# Patient Record
Sex: Female | Born: 2000 | Race: Black or African American | Hispanic: No | Marital: Single | Smoking: Former smoker
Health system: Southern US, Community
[De-identification: ages and names within clinical notes are randomized; demographics above are authoritative.]

## PROBLEM LIST (undated history)

## (undated) ENCOUNTER — Emergency Department (HOSPITAL_COMMUNITY): Payer: Medicaid Other

## (undated) ENCOUNTER — Inpatient Hospital Stay: Discharge status: Absent without leave | Payer: Self-pay

## (undated) DIAGNOSIS — F419 Anxiety disorder, unspecified: Secondary | ICD-10-CM

## (undated) DIAGNOSIS — N390 Urinary tract infection, site not specified: Secondary | ICD-10-CM

## (undated) DIAGNOSIS — Z789 Other specified health status: Secondary | ICD-10-CM

## (undated) HISTORY — DX: Anxiety disorder, unspecified: F41.9

## (undated) HISTORY — DX: Other specified health status: Z78.9

## (undated) HISTORY — PX: NO PAST SURGERIES: SHX2092

## (undated) HISTORY — PX: MOUTH SURGERY: SHX715

---
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
## 2020-01-02 ENCOUNTER — Telehealth (HOSPITAL_COMMUNITY): Payer: Self-pay | Admitting: Genetic Counselor

## 2020-01-02 LAB — AFP, SERUM, OPEN SPINA BIFIDA
AFP MoM: 0.89
AFP Value: 67.2 ng/mL
Gest. Age on Collection Date: 21.7 weeks
Maternal Age At EDD: 18.8 yr
OSBR Risk 1 IN: 10000
Test Results:: NEGATIVE
Weight: 160 [lb_av]

## 2020-01-02 NOTE — Telephone Encounter (Signed)
I called Ms. Decook to discuss her partner Doy Mince Currie's results from his alpha-thalassemia carrier screening. Mr. Laureen Ochs was identified to be a silent carrier for alpha-thalassemia just like Ms. Record.   As we discussed during our genetic counseling appointment, the couple's fetus would only be at risk forhemoglobin H disease (AKA alpha-thalassemia) if Mr. Laureen Ochs were identified to be a carrier for two alpha globin mutations in cis (aa/--). Since Mr. Laureen Ochs is also a silent carrier (aa/a-), the couple's fetus is not at increased risk to be affected by the condition. Instead, the couple has a 1 in 4 (25%) chance of having an unaffected child with all four copies of the alpha globin genes present (aa/aa), a 1 in 2 (50%) chance of having a child who is a silent carrier (aa/a-), and a 1 in 4 (25%) chance of having an unaffected child who is a carrier in trans (a-/a-). Ms. Monfils inquired whether or not her daughter should be tested for her carrier status postnatally. We discussed that when she is planning to have a family of her own someday, she could consider undergoing carrier screening for alpha-thalassemia. If she were found to be a carrier, it would be recommended that her partner also undergo screening to determine the risk for their children to be affected, as was the case for Ms. Epping and Mr. Laureen Ochs.  Ms. Cumberland was relieved to hear of her partner's testing results. She confirmed that she had no further questions at this time.  Gershon Crane, MS Genetic Counselor

## 2020-01-28 ENCOUNTER — Telehealth (INDEPENDENT_AMBULATORY_CARE_PROVIDER_SITE_OTHER): Payer: Medicaid Other | Admitting: Obstetrics

## 2020-01-28 ENCOUNTER — Encounter: Payer: Self-pay | Admitting: Obstetrics

## 2020-01-28 VITALS — BP 118/67 | HR 85

## 2020-01-28 DIAGNOSIS — Z3400 Encounter for supervision of normal first pregnancy, unspecified trimester: Secondary | ICD-10-CM

## 2020-01-28 DIAGNOSIS — M549 Dorsalgia, unspecified: Secondary | ICD-10-CM

## 2020-01-28 DIAGNOSIS — D563 Thalassemia minor: Secondary | ICD-10-CM

## 2020-01-28 DIAGNOSIS — Z3A25 25 weeks gestation of pregnancy: Secondary | ICD-10-CM

## 2020-01-28 DIAGNOSIS — O26892 Other specified pregnancy related conditions, second trimester: Secondary | ICD-10-CM

## 2020-01-28 MED ORDER — COMFORT FIT MATERNITY SUPP SM MISC: 0 refills | Status: DC

## 2020-01-28 NOTE — Progress Notes (Signed)
S/W pt for virtual visit. Pt reports fetal movement, denies pain.

## 2020-01-28 NOTE — Progress Notes (Signed)
   TELEHEALTH OBSTETRICS PRENATAL VIRTUAL VIDEO VISIT ENCOUNTER NOTE  Provider location: [facility] for [facility] [place]   I connected with Tate A Mantei on 01/28/20 at  2:00 PM EST by OB MyChart Video Encounter at home and verified that I am speaking with the correct person using two identifiers.   I discussed the limitations, risks, security and privacy concerns of performing an evaluation and management service virtually and the availability of in person appointments. I also discussed with the patient that there may be a patient responsible charge related to this service. The patient expressed understanding and agreed to proceed.  Subjective:  Joyce Hodge is a 19 y.o. G1P0 at [redacted]w[redacted]d being seen today for ongoing prenatal care.  She is currently monitored for the following issues for this low-risk pregnancy and has Supervision of normal first teen pregnancy and Alpha thalassemia silent carrier on their problem list.  Patient reports backache.  Contractions: Not present. Vag. Bleeding: None.  Movement: Present. Denies any leaking of fluid.   The following portions of the patient's history were reviewed and updated as appropriate: allergies, current medications, past family history, past medical history, past social history, past surgical history and problem list.   Objective:   Vitals:   01/28/20 1400  BP: 118/67  Pulse: 85    Fetal Status:     Movement: Present     General:  Alert, oriented and cooperative. Patient is in no acute distress.  Respiratory: Normal respiratory effort, no problems with respiration noted  Mental Status: Normal mood and affect. Normal behavior. Normal judgment and thought content.  Rest of physical exam deferred due to type of encounter  Imaging: No results found.  Assessment and Plan:  Pregnancy: G1P0 at [redacted]w[redacted]d  1. Encounter for supervision of normal pregnancy in teen primigravida, antepartum  2. Alpha thalassemia silent carrier  3.  Backache symptom Rx: - Elastic Bandages & Supports (COMFORT FIT MATERNITY SUPP SM) MISC; Take as directed.  Dispense: 1 each; Refill: 0   Preterm labor symptoms and general obstetric precautions including but not limited to vaginal bleeding, contractions, leaking of fluid and fetal movement were reviewed in detail with the patient. I discussed the assessment and treatment plan with the patient. The patient was provided an opportunity to ask questions and all were answered. The patient agreed with the plan and demonstrated an understanding of the instructions. The patient was advised to call back or seek an in-person office evaluation/go to MAU at [facility] for any urgent or concerning symptoms. Please refer to After Visit Summary for other counseling recommendations.   I provided 10 minutes of face-to-face time during this encounter.  No follow-ups on file.  Future Appointments  Date Time Provider Department Center  02/18/2020  9:15 AM [facility] None  02/18/2020  9:35 AM Burleson, Brand Males, NP [facility] None    Coral Ceo, MD Center for [facility], [facility] 01/28/2020

## 2020-02-18 ENCOUNTER — Ambulatory Visit (INDEPENDENT_AMBULATORY_CARE_PROVIDER_SITE_OTHER): Payer: Medicaid Other | Admitting: Nurse Practitioner

## 2020-02-18 ENCOUNTER — Other Ambulatory Visit: Payer: Self-pay

## 2020-02-18 ENCOUNTER — Other Ambulatory Visit: Payer: Medicaid Other

## 2020-02-18 ENCOUNTER — Encounter: Payer: Self-pay | Admitting: Nurse Practitioner

## 2020-02-18 VITALS — BP 101/66 | HR 96 | Wt 169.0 lb

## 2020-02-18 DIAGNOSIS — Z3400 Encounter for supervision of normal first pregnancy, unspecified trimester: Secondary | ICD-10-CM | POA: Diagnosis not present

## 2020-02-18 DIAGNOSIS — Z3403 Encounter for supervision of normal first pregnancy, third trimester: Secondary | ICD-10-CM

## 2020-02-18 DIAGNOSIS — Z23 Encounter for immunization: Secondary | ICD-10-CM

## 2020-02-18 DIAGNOSIS — Z3A28 28 weeks gestation of pregnancy: Secondary | ICD-10-CM

## 2020-02-18 NOTE — Progress Notes (Signed)
    Subjective:  Joyce Hodge is a 19 y.o. G1P0 at [redacted]w[redacted]d being seen today for ongoing prenatal care.  She is currently monitored for the following issues for this low-risk pregnancy and has Supervision of normal first teen pregnancy and Alpha thalassemia silent carrier on their problem list.  Patient reports no complaints.  Contractions: Not present. Vag. Bleeding: None.  Movement: Present. Denies leaking of fluid.   The following portions of the patient's history were reviewed and updated as appropriate: allergies, current medications, past family history, past medical history, past social history, past surgical history and problem list. Problem list updated.  Objective:   Vitals:   02/18/20 0950  BP: 101/66  Pulse: 96  Weight: 169 lb (76.7 kg)    Fetal Status:   Fundal Height: 28 cm Movement: Present     General:  Alert, oriented and cooperative. Patient is in no acute distress.  Skin: Skin is warm and dry. No rash noted.   Cardiovascular: Normal heart rate noted  Respiratory: Normal respiratory effort, no problems with respiration noted  Abdomen: Soft, gravid, appropriate for gestational age. Pain/Pressure: Absent     Pelvic:  Cervical exam deferred        Extremities: Normal range of motion.  Edema: None  Mental Status: Normal mood and affect. Normal behavior. Normal judgment and thought content.   Urinalysis:      Assessment and Plan:  Pregnancy: G1P0 at [redacted]w[redacted]d  1. Encounter for supervision of normal pregnancy in teen primigravida, antepartum Doing well. Babyscripts data reviewed - nomal BPs Had flu at work this year Getting TDAP today. Reviewed signing up for childbirth and breastfeeding online classes - reviewed where to find more info in Babyscripts.  - Glucose Tolerance, 2 Hours w/1 Hour - CBC - RPR - HIV Antibody (routine testing w rflx)  2. Need for diphtheria-tetanus-pertussis (Tdap) vaccine Given  - Tdap vaccine greater than or equal to 7yo  IM  Preterm labor symptoms and general obstetric precautions including but not limited to vaginal bleeding, contractions, leaking of fluid and fetal movement were reviewed in detail with the patient. Please refer to After Visit Summary for other counseling recommendations.  Return in about 2 weeks (around 03/03/2020) for virtual ROB.  Nolene Bernheim, RN, MSN, NP-BC Nurse Practitioner, [facility] for [employer], [facility] 02/18/2020 10:34 AM

## 2020-02-18 NOTE — Progress Notes (Signed)
ROB with no complaints.   T-DAP

## 2020-02-19 LAB — CBC
Hematocrit: 32.8 % — ABNORMAL LOW (ref 34.0–46.6)
Hemoglobin: 10.5 g/dL — ABNORMAL LOW (ref 11.1–15.9)
MCH: 28.5 pg (ref 26.6–33.0)
MCHC: 32 g/dL (ref 31.5–35.7)
MCV: 89 fL (ref 79–97)
Platelets: 182 10*3/uL (ref 150–450)
RBC: 3.68 x10E6/uL — ABNORMAL LOW (ref 3.77–5.28)
RDW: 12.9 % (ref 11.7–15.4)
WBC: 8.2 10*3/uL (ref 3.4–10.8)

## 2020-02-19 LAB — GLUCOSE TOLERANCE, 2 HOURS W/ 1HR
Glucose, 1 hour: 170 mg/dL (ref 65–179)
Glucose, 2 hour: 136 mg/dL (ref 65–152)
Glucose, Fasting: 79 mg/dL (ref 65–91)

## 2020-02-19 LAB — RPR: RPR Ser Ql: NONREACTIVE

## 2020-02-19 LAB — HIV ANTIBODY (ROUTINE TESTING W REFLEX): HIV Screen 4th Generation wRfx: NONREACTIVE

## 2020-03-03 ENCOUNTER — Telehealth (INDEPENDENT_AMBULATORY_CARE_PROVIDER_SITE_OTHER): Payer: Medicaid Other | Admitting: Obstetrics

## 2020-03-03 ENCOUNTER — Encounter: Payer: Self-pay | Admitting: Obstetrics

## 2020-03-03 VITALS — BP 121/63 | HR 106

## 2020-03-03 DIAGNOSIS — Z3400 Encounter for supervision of normal first pregnancy, unspecified trimester: Secondary | ICD-10-CM

## 2020-03-03 DIAGNOSIS — Z3A30 30 weeks gestation of pregnancy: Secondary | ICD-10-CM

## 2020-03-03 DIAGNOSIS — Z3403 Encounter for supervision of normal first pregnancy, third trimester: Secondary | ICD-10-CM

## 2020-03-03 NOTE — Progress Notes (Signed)
Pt has had swelling in left leg. Pt has been feeling dizzy and lightheaded-first occurrence.

## 2020-03-03 NOTE — Progress Notes (Signed)
   TELEHEALTH OBSTETRICS PRENATAL VIRTUAL VIDEO VISIT ENCOUNTER NOTE  Provider location: [facility] for [facility] [place]   I connected with Joyce Hodge on 03/03/20 at 10:45 AM EST by OB MyChart Video Encounter at home and verified that I am speaking with the correct person using two identifiers.   I discussed the limitations, risks, security and privacy concerns of performing an evaluation and management service virtually and the availability of in person appointments. I also discussed with the patient that there may be a patient responsible charge related to this service. The patient expressed understanding and agreed to proceed. Subjective:  Joyce Hodge is a 19 y.o. G1P0 at [redacted]w[redacted]d being seen today for ongoing prenatal care.  She is currently monitored for the following issues for this low-risk pregnancy and has Supervision of normal first teen pregnancy and Alpha thalassemia silent carrier on their problem list.  Patient reports swelling in legs and occasional dizziness.  Contractions: Not present. Vag. Bleeding: None.  Movement: Present. Denies any leaking of fluid.   The following portions of the patient's history were reviewed and updated as appropriate: allergies, current medications, past family history, past medical history, past social history, past surgical history and problem list.   Objective:   Vitals:   03/03/20 1037  BP: 121/63  Pulse: (!) 106    Fetal Status:     Movement: Present     General:  Alert, oriented and cooperative. Patient is in no acute distress.  Respiratory: Normal respiratory effort, no problems with respiration noted  Mental Status: Normal mood and affect. Normal behavior. Normal judgment and thought content.  Rest of physical exam deferred due to type of encounter  Imaging: No results found.  Assessment and Plan:  Pregnancy: G1P0 at [redacted]w[redacted]d 1. Encounter for supervision of normal pregnancy in teen primigravida, antepartum   Preterm  labor symptoms and general obstetric precautions including but not limited to vaginal bleeding, contractions, leaking of fluid and fetal movement were reviewed in detail with the patient. I discussed the assessment and treatment plan with the patient. The patient was provided an opportunity to ask questions and all were answered. The patient agreed with the plan and demonstrated an understanding of the instructions. The patient was advised to call back or seek an in-person office evaluation/go to [facility]U at [facility] for any urgent or concerning symptoms. Please refer to After Visit Summary for other counseling recommendations.   I provided 10 minutes of face-to-face time during this encounter.  Return in about 2 weeks (around 03/17/2020) for MyChart.   Coral Ceo, MD Center for [facility], [facility] 03/03/2020

## 2020-03-17 ENCOUNTER — Encounter: Payer: Self-pay | Admitting: Obstetrics

## 2020-03-17 ENCOUNTER — Telehealth (INDEPENDENT_AMBULATORY_CARE_PROVIDER_SITE_OTHER): Payer: Medicaid Other | Admitting: Obstetrics

## 2020-03-17 DIAGNOSIS — Z5329 Procedure and treatment not carried out because of patient's decision for other reasons: Secondary | ICD-10-CM

## 2020-03-17 DIAGNOSIS — Z3400 Encounter for supervision of normal first pregnancy, unspecified trimester: Secondary | ICD-10-CM

## 2020-03-17 NOTE — Progress Notes (Signed)
Patient did not answer nurse's telephone call. 

## 2020-03-25 ENCOUNTER — Telehealth (INDEPENDENT_AMBULATORY_CARE_PROVIDER_SITE_OTHER): Payer: Medicaid Other | Admitting: Obstetrics & Gynecology

## 2020-03-25 VITALS — BP 102/63 | HR 95

## 2020-03-25 DIAGNOSIS — Z3A33 33 weeks gestation of pregnancy: Secondary | ICD-10-CM | POA: Diagnosis not present

## 2020-03-25 DIAGNOSIS — Z3403 Encounter for supervision of normal first pregnancy, third trimester: Secondary | ICD-10-CM | POA: Diagnosis not present

## 2020-03-25 NOTE — Patient Instructions (Signed)

## 2020-03-25 NOTE — Progress Notes (Signed)
   TELEHEALTH VIRTUAL OBSTETRICS VISIT ENCOUNTER NOTE  I connected with Joyce Hodge on 03/25/20 at  1:00 PM EDT by telephone at home and verified that I am speaking with the correct person using two identifiers.   I discussed the limitations, risks, security and privacy concerns of performing an evaluation and management service by telephone and the availability of in person appointments. I also discussed with the patient that there may be a patient responsible charge related to this service. The patient expressed understanding and agreed to proceed.  Subjective:  Joyce Hodge is a 19 y.o. G1P0 at [redacted]w[redacted]d being followed for ongoing prenatal care.  She is currently monitored for the following issues for this low-risk pregnancy and has Supervision of normal first teen pregnancy and Alpha thalassemia silent carrier on their problem list.  Patient reports occasional contractions. Reports fetal movement. Denies any contractions, bleeding or leaking of fluid.   The following portions of the patient's history were reviewed and updated as appropriate: allergies, current medications, past family history, past medical history, past social history, past surgical history and problem list.   Objective:   General:  Alert, oriented and cooperative.   Mental Status: Normal mood and affect perceived. Normal judgment and thought content.  Rest of physical exam deferred due to type of encounter  Assessment and Plan:  Pregnancy: G1P0 at [redacted]w[redacted]d 1. Supervision of normal first teen pregnancy in third trimester Monitor fetal movement  Preterm labor symptoms and general obstetric precautions including but not limited to vaginal bleeding, contractions, leaking of fluid and fetal movement were reviewed in detail with the patient.  I discussed the assessment and treatment plan with the patient. The patient was provided an opportunity to ask questions and all were answered. The patient agreed with the plan and  demonstrated an understanding of the instructions. The patient was advised to call back or seek an in-person office evaluation/go to MAU at [facility] for any urgent or concerning symptoms. Please refer to After Visit Summary for other counseling recommendations.   I provided 10 minutes of non-face-to-face time during this encounter.  Return in about 2 weeks (around 04/08/2020) for GBS.  No future appointments.  Scheryl Darter, MD Center for [facility], [facility]

## 2020-03-25 NOTE — Progress Notes (Signed)
Please discuss fetal movement with pt today.

## 2020-04-08 ENCOUNTER — Other Ambulatory Visit (HOSPITAL_COMMUNITY)
Admission: RE | Admit: 2020-04-08 | Discharge: 2020-04-08 | Disposition: A | Discharge status: Home / Self Care | Payer: Medicaid Other | Source: Ambulatory Visit | Attending: Advanced Practice Midwife | Admitting: Advanced Practice Midwife

## 2020-04-08 ENCOUNTER — Other Ambulatory Visit: Payer: Self-pay

## 2020-04-08 ENCOUNTER — Ambulatory Visit (INDEPENDENT_AMBULATORY_CARE_PROVIDER_SITE_OTHER): Payer: Medicaid Other | Admitting: Advanced Practice Midwife

## 2020-04-08 DIAGNOSIS — Z3403 Encounter for supervision of normal first pregnancy, third trimester: Secondary | ICD-10-CM

## 2020-04-08 DIAGNOSIS — Z3A35 35 weeks gestation of pregnancy: Secondary | ICD-10-CM

## 2020-04-08 NOTE — Progress Notes (Signed)
   PRENATAL VISIT NOTE  Subjective:  Joyce Hodge is a 19 y.o. G1P0 at [redacted]w[redacted]d being seen today for ongoing prenatal care.  She is currently monitored for the following issues for this low-risk pregnancy and has Supervision of normal first teen pregnancy and Alpha thalassemia silent carrier on their problem list.  Patient reports no complaints.  Contractions: Irritability. Vag. Bleeding: None.  Movement: Present. Denies leaking of fluid.   The following portions of the patient's history were reviewed and updated as appropriate: allergies, current medications, past family history, past medical history, past social history, past surgical history and problem list.   Objective:   Vitals:   04/08/20 1351  BP: 110/66  Pulse: (!) 106  Weight: 180 lb (81.6 kg)    Fetal Status: Fetal Heart Rate (bpm): 154 Fundal Height: 36 cm Movement: Present  Presentation: Vertex  General:  Alert, oriented and cooperative. Patient is in no acute distress.  Skin: Skin is warm and dry. No rash noted.   Cardiovascular: Normal heart rate noted  Respiratory: Normal respiratory effort, no problems with respiration noted  Abdomen: Soft, gravid, appropriate for gestational age.  Pain/Pressure: Absent     Pelvic: Cervical exam performed in the presence of a chaperone Dilation: Fingertip Effacement (%): 0 Station: Ballotable  Extremities: Normal range of motion.  Edema: None  Mental Status: Normal mood and affect. Normal behavior. Normal judgment and thought content.   Assessment and Plan:  Pregnancy: G1P0 at [redacted]w[redacted]d 1. Supervision of normal first teen pregnancy in third trimester --Anticipatory guidance about next visits/weeks of pregnancy given. --Next visit in 2 weeks virtual, then at 39 weeks in office  - Cervicovaginal ancillary only( [place]) - Strep Gp B NAA  Preterm labor symptoms and general obstetric precautions including but not limited to vaginal bleeding, contractions, leaking of fluid and  fetal movement were reviewed in detail with the patient. Please refer to After Visit Summary for other counseling recommendations.   Return in about 2 weeks (around 04/22/2020).  Future Appointments  Date Time Provider Department Center  04/22/2020 11:15 AM [place], Wilmer Floor, CNM [facility] None    Sharen Counter, CNM

## 2020-04-08 NOTE — Progress Notes (Signed)
ROB/GBS. Reports no problems today. 

## 2020-04-09 LAB — CERVICOVAGINAL ANCILLARY ONLY
Chlamydia: NEGATIVE
Comment: NEGATIVE
Comment: NORMAL
Neisseria Gonorrhea: NEGATIVE

## 2020-04-10 LAB — STREP GP B NAA: Strep Gp B NAA: NEGATIVE

## 2020-04-22 ENCOUNTER — Other Ambulatory Visit: Payer: Self-pay

## 2020-04-22 ENCOUNTER — Ambulatory Visit (INDEPENDENT_AMBULATORY_CARE_PROVIDER_SITE_OTHER): Payer: Medicaid Other | Admitting: Obstetrics

## 2020-04-22 ENCOUNTER — Encounter: Payer: Self-pay | Admitting: Obstetrics

## 2020-04-22 VITALS — BP 115/65 | HR 91 | Wt 182.0 lb

## 2020-04-22 DIAGNOSIS — Z3403 Encounter for supervision of normal first pregnancy, third trimester: Secondary | ICD-10-CM

## 2020-04-22 DIAGNOSIS — D563 Thalassemia minor: Secondary | ICD-10-CM

## 2020-04-22 NOTE — Progress Notes (Signed)
ROB  CC: Pt wants cervix check

## 2020-04-22 NOTE — Progress Notes (Signed)
Subjective:  Joyce Hodge is a 19 y.o. G1P0 at [redacted]w[redacted]d being seen today for ongoing prenatal care.  She is currently monitored for the following issues for this low-risk pregnancy and has Supervision of normal first teen pregnancy and Alpha thalassemia silent carrier on their problem list.  Patient reports occasional contractions.  Contractions: Irritability. Vag. Bleeding: None.  Movement: Present. Denies leaking of fluid.   The following portions of the patient's history were reviewed and updated as appropriate: allergies, current medications, past family history, past medical history, past social history, past surgical history and problem list. Problem list updated.  Objective:   Vitals:   04/22/20 1123  BP: 115/65  Pulse: 91  Weight: 182 lb (82.6 kg)    Fetal Status:     Movement: Present     General:  Alert, oriented and cooperative. Patient is in no acute distress.  Skin: Skin is warm and dry. No rash noted.   Cardiovascular: Normal heart rate noted  Respiratory: Normal respiratory effort, no problems with respiration noted  Abdomen: Soft, gravid, appropriate for gestational age. Pain/Pressure: Present     Pelvic:  Cervical exam deferred        Extremities: Normal range of motion.  Edema: Trace  Mental Status: Normal mood and affect. Normal behavior. Normal judgment and thought content.   Urinalysis:      Assessment and Plan:  Pregnancy: G1P0 at [redacted]w[redacted]d  There are no diagnoses linked to this encounter. term labor symptoms and general obstetric precautions including but not limited to vaginal bleeding, contractions, leaking of fluid and fetal movement were reviewed in detail with the patient. Please refer to After Visit Summary for other counseling recommendations.    Brock Bad, MD  04/22/2020

## 2020-04-29 ENCOUNTER — Other Ambulatory Visit: Payer: Self-pay

## 2020-04-29 ENCOUNTER — Ambulatory Visit (INDEPENDENT_AMBULATORY_CARE_PROVIDER_SITE_OTHER): Payer: Medicaid Other | Admitting: Family Medicine

## 2020-04-29 ENCOUNTER — Encounter: Payer: Self-pay | Admitting: Family Medicine

## 2020-04-29 DIAGNOSIS — Z3403 Encounter for supervision of normal first pregnancy, third trimester: Secondary | ICD-10-CM

## 2020-04-29 NOTE — Progress Notes (Signed)
   PRENATAL VISIT NOTE  Subjective:  Joyce Hodge is a 19 y.o. G1P0 at [redacted]w[redacted]d being seen today for ongoing prenatal care.  She is currently monitored for the following issues for this low-risk pregnancy and has Supervision of normal first teen pregnancy and Alpha thalassemia silent carrier on their problem list.  Patient reports no complaints.  Contractions: Irritability. Vag. Bleeding: None.  Movement: Present. Denies leaking of fluid.   The following portions of the patient's history were reviewed and updated as appropriate: allergies, current medications, past family history, past medical history, past social history, past surgical history and problem list.   Objective:   Vitals:   04/29/20 1539  BP: 125/81  Pulse: (!) 112  Weight: 185 lb 6.4 oz (84.1 kg)    Fetal Status: Fetal Heart Rate (bpm): 140  Fundal Height: 39 cm Movement: Present  Presentation: Vertex  General:  Alert, oriented and cooperative. Patient is in no acute distress.  Skin: Skin is warm and dry. No rash noted.   Cardiovascular: Normal heart rate noted  Respiratory: Normal respiratory effort, no problems with respiration noted  Abdomen: Soft, gravid, appropriate for gestational age.  Pain/Pressure: Present     Pelvic: Cervical exam performed in the presence of a chaperone Dilation: 1 Effacement (%): 0 Station: Ballotable  Extremities: Normal range of motion.  Edema: Trace  Mental Status: Normal mood and affect. Normal behavior. Normal judgment and thought content.   Assessment and Plan:  Pregnancy: G1P0 at [redacted]w[redacted]d 1. Supervision of normal first teen pregnancy in third trimester - RTC in 1 week with NST; IOL scheduled and orders placed - GBS neg - girl, both, OCP  Term labor symptoms and general obstetric precautions including but not limited to vaginal bleeding, contractions, leaking of fluid and fetal movement were reviewed in detail with the patient. Please refer to After Visit Summary for other  counseling recommendations.   Return in about 1 week (around 05/06/2020) for LOB; in-person.  Future Appointments  Date Time Provider Department Center  04/29/2020  4:00 PM Seniyah Esker, Hoyle Sauer, MD [facility] None    Joselyn Arrow, MD

## 2020-04-29 NOTE — Progress Notes (Signed)
Pt presents for ROB  Pt requests cx check today Pt wants BCP after delivery

## 2020-04-30 ENCOUNTER — Telehealth (HOSPITAL_COMMUNITY): Payer: Self-pay | Admitting: *Deleted

## 2020-04-30 NOTE — Telephone Encounter (Signed)
Preadmission screen  

## 2020-05-01 ENCOUNTER — Encounter (HOSPITAL_COMMUNITY): Payer: Self-pay | Admitting: *Deleted

## 2020-05-01 ENCOUNTER — Telehealth (HOSPITAL_COMMUNITY): Payer: Self-pay | Admitting: *Deleted

## 2020-05-01 NOTE — Telephone Encounter (Signed)
Preadmission screen  

## 2020-05-05 ENCOUNTER — Other Ambulatory Visit: Payer: Self-pay | Admitting: Family Medicine

## 2020-05-07 ENCOUNTER — Other Ambulatory Visit: Payer: Self-pay

## 2020-05-07 ENCOUNTER — Encounter: Payer: Medicaid Other | Admitting: Advanced Practice Midwife

## 2020-05-07 ENCOUNTER — Ambulatory Visit (INDEPENDENT_AMBULATORY_CARE_PROVIDER_SITE_OTHER): Payer: Medicaid Other | Admitting: Obstetrics

## 2020-05-07 ENCOUNTER — Encounter: Payer: Self-pay | Admitting: Obstetrics

## 2020-05-07 VITALS — BP 131/80 | HR 101 | Wt 182.0 lb

## 2020-05-07 DIAGNOSIS — Z3A40 40 weeks gestation of pregnancy: Secondary | ICD-10-CM

## 2020-05-07 DIAGNOSIS — Z3400 Encounter for supervision of normal first pregnancy, unspecified trimester: Secondary | ICD-10-CM

## 2020-05-07 NOTE — Progress Notes (Signed)
Subjective:  Joyce Hodge is a 20 y.o. G1P0 at [redacted]w[redacted]d being seen today for ongoing prenatal care.  She is currently monitored for the following issues for this low-risk pregnancy and has Supervision of normal first teen pregnancy and Alpha thalassemia silent carrier on their problem list.  Patient reports no complaints.   .  .   . Denies leaking of fluid.   The following portions of the patient's history were reviewed and updated as appropriate: allergies, current medications, past family history, past medical history, past social history, past surgical history and problem list. Problem list updated.  Objective:  There were no vitals filed for this visit.  Fetal Status:           General:  Alert, oriented and cooperative. Patient is in no acute distress.  Skin: Skin is warm and dry. No rash noted.   Cardiovascular: Normal heart rate noted  Respiratory: Normal respiratory effort, no problems with respiration noted  Abdomen: Soft, gravid, appropriate for gestational age.       Pelvic:  Cervical exam deferred        Extremities: Normal range of motion.     Mental Status: Normal mood and affect. Normal behavior. Normal judgment and thought content.   Urinalysis:      Assessment and Plan:  Pregnancy: G1P0 at [redacted]w[redacted]d  1. Encounter for supervision of normal pregnancy in teen primigravida, antepartum Rx: - Fetal nonstress test:  Reactive, FHR 150's with good variability, 15x15 accels, no decels, occasional contraction  Term labor symptoms and general obstetric precautions including but not limited to vaginal bleeding, contractions, leaking of fluid and fetal movement were reviewed in detail with the patient. Please refer to After Visit Summary for other counseling recommendations.  No follow-ups on file.   Brock Bad, MD

## 2020-05-07 NOTE — Progress Notes (Signed)
ROB NST today per last office notes Pt given juice and cracker since she has not had breakfast today.

## 2020-05-11 ENCOUNTER — Inpatient Hospital Stay (HOSPITAL_COMMUNITY): Payer: Medicaid Other | Admitting: Anesthesiology

## 2020-05-11 ENCOUNTER — Other Ambulatory Visit: Payer: Self-pay

## 2020-05-11 ENCOUNTER — Encounter (HOSPITAL_COMMUNITY): Payer: Self-pay | Admitting: Obstetrics and Gynecology

## 2020-05-11 ENCOUNTER — Inpatient Hospital Stay (HOSPITAL_COMMUNITY)
Admission: AD | Admit: 2020-05-11 | Discharge: 2020-05-14 | DRG: 833 | Disposition: A | Discharge status: Home / Self Care | Payer: Medicaid Other | Attending: Obstetrics and Gynecology | Admitting: Obstetrics and Gynecology

## 2020-05-11 ENCOUNTER — Inpatient Hospital Stay (EMERGENCY_DEPARTMENT_HOSPITAL)
Admission: AD | Admit: 2020-05-11 | Discharge: 2020-05-11 | Disposition: A | Discharge status: Home / Self Care | Payer: Medicaid Other | Source: Home / Self Care | Attending: Obstetrics and Gynecology | Admitting: Obstetrics and Gynecology

## 2020-05-11 DIAGNOSIS — O471 False labor at or after 37 completed weeks of gestation: Secondary | ICD-10-CM | POA: Diagnosis not present

## 2020-05-11 DIAGNOSIS — O9902 Anemia complicating childbirth: Secondary | ICD-10-CM | POA: Diagnosis not present

## 2020-05-11 DIAGNOSIS — O480 Post-term pregnancy: Secondary | ICD-10-CM | POA: Diagnosis present

## 2020-05-11 DIAGNOSIS — Z3A40 40 weeks gestation of pregnancy: Secondary | ICD-10-CM

## 2020-05-11 DIAGNOSIS — O479 False labor, unspecified: Secondary | ICD-10-CM

## 2020-05-11 DIAGNOSIS — D649 Anemia, unspecified: Secondary | ICD-10-CM | POA: Diagnosis not present

## 2020-05-11 DIAGNOSIS — Z20822 Contact with and (suspected) exposure to covid-19: Secondary | ICD-10-CM | POA: Diagnosis present

## 2020-05-11 DIAGNOSIS — Z87891 Personal history of nicotine dependence: Secondary | ICD-10-CM | POA: Diagnosis not present

## 2020-05-11 DIAGNOSIS — Z3400 Encounter for supervision of normal first pregnancy, unspecified trimester: Secondary | ICD-10-CM

## 2020-05-11 LAB — CBC
HCT: 35.9 % — ABNORMAL LOW (ref 36.0–46.0)
Hemoglobin: 11.4 g/dL — ABNORMAL LOW (ref 12.0–15.0)
MCH: 27.7 pg (ref 26.0–34.0)
MCHC: 31.8 g/dL (ref 30.0–36.0)
MCV: 87.3 fL (ref 80.0–100.0)
Platelets: 191 10*3/uL (ref 150–400)
RBC: 4.11 MIL/uL (ref 3.87–5.11)
RDW: 14.2 % (ref 11.5–15.5)
WBC: 8 10*3/uL (ref 4.0–10.5)
nRBC: 0 % (ref 0.0–0.2)

## 2020-05-11 LAB — ABO/RH: ABO/RH(D): O POS

## 2020-05-11 LAB — TYPE AND SCREEN
ABO/RH(D): O POS
Antibody Screen: NEGATIVE

## 2020-05-11 LAB — SARS CORONAVIRUS 2 BY RT PCR (HOSPITAL ORDER, PERFORMED IN ~~LOC~~ HOSPITAL LAB): SARS Coronavirus 2: NEGATIVE

## 2020-05-11 LAB — POCT FERN TEST: POCT Fern Test: POSITIVE

## 2020-05-11 MED ORDER — OXYMETAZOLINE HCL 0.05 % NA SOLN
1.0000 | Freq: Two times a day (BID) | NASAL | Status: DC
  Administered 2020-05-11 – 2020-05-12 (×2): 1 via NASAL
  Filled 2020-05-11: qty 30

## 2020-05-11 MED ORDER — LACTATED RINGERS IV SOLN: INTRAVENOUS | Status: DC

## 2020-05-11 MED ORDER — TERBUTALINE SULFATE 1 MG/ML IJ SOLN: 0.2500 mg | Freq: Once | INTRAMUSCULAR | Status: DC | PRN

## 2020-05-11 MED ORDER — ONDANSETRON HCL 4 MG/2ML IJ SOLN
4.0000 mg | Freq: Four times a day (QID) | INTRAMUSCULAR | Status: DC | PRN
  Administered 2020-05-11 – 2020-05-12 (×2): 4 mg via INTRAVENOUS
  Filled 2020-05-11 (×2): qty 2

## 2020-05-11 MED ORDER — LIDOCAINE HCL (PF) 1 % IJ SOLN
INTRAMUSCULAR | Status: DC | PRN
  Administered 2020-05-11 (×2): 4 mL via EPIDURAL

## 2020-05-11 MED ORDER — OXYTOCIN BOLUS FROM INFUSION
500.0000 mL | Freq: Once | INTRAVENOUS | Status: AC
  Administered 2020-05-12: 500 mL via INTRAVENOUS

## 2020-05-11 MED ORDER — FENTANYL CITRATE (PF) 100 MCG/2ML IJ SOLN: 100.0000 ug | INTRAMUSCULAR | Status: DC | PRN

## 2020-05-11 MED ORDER — SOD CITRATE-CITRIC ACID 500-334 MG/5ML PO SOLN: 30.0000 mL | ORAL | Status: DC | PRN

## 2020-05-11 MED ORDER — ACETAMINOPHEN 325 MG PO TABS: 650.0000 mg | ORAL_TABLET | ORAL | Status: DC | PRN

## 2020-05-11 MED ORDER — LACTATED RINGERS IV SOLN: 500.0000 mL | Freq: Once | INTRAVENOUS | Status: DC

## 2020-05-11 MED ORDER — LACTATED RINGERS IV SOLN
500.0000 mL | Freq: Once | INTRAVENOUS | Status: AC
  Administered 2020-05-12: 500 mL via INTRAVENOUS

## 2020-05-11 MED ORDER — OXYTOCIN 40 UNITS IN NORMAL SALINE INFUSION - SIMPLE MED
2.5000 [IU]/h | INTRAVENOUS | Status: DC
  Filled 2020-05-11: qty 1000

## 2020-05-11 MED ORDER — SALINE SPRAY 0.65 % NA SOLN: 1.0000 | NASAL | Status: DC | PRN

## 2020-05-11 MED ORDER — LIDOCAINE HCL (PF) 1 % IJ SOLN: 30.0000 mL | INTRAMUSCULAR | Status: DC | PRN

## 2020-05-11 MED ORDER — LACTATED RINGERS IV SOLN: 500.0000 mL | INTRAVENOUS | Status: DC | PRN

## 2020-05-11 MED ORDER — SODIUM CHLORIDE (PF) 0.9 % IJ SOLN
INTRAMUSCULAR | Status: DC | PRN
  Administered 2020-05-11: 11 mL/h via EPIDURAL

## 2020-05-11 MED ORDER — EPHEDRINE 5 MG/ML INJ: 10.0000 mg | INTRAVENOUS | Status: DC | PRN

## 2020-05-11 MED ORDER — PHENYLEPHRINE 40 MCG/ML (10ML) SYRINGE FOR IV PUSH (FOR BLOOD PRESSURE SUPPORT): 80.0000 ug | PREFILLED_SYRINGE | INTRAVENOUS | Status: DC | PRN

## 2020-05-11 MED ORDER — FENTANYL-BUPIVACAINE-NACL 0.5-0.125-0.9 MG/250ML-% EP SOLN
12.0000 mL/h | EPIDURAL | Status: DC | PRN
  Filled 2020-05-11: qty 250

## 2020-05-11 MED ORDER — DIPHENHYDRAMINE HCL 50 MG/ML IJ SOLN: 12.5000 mg | INTRAMUSCULAR | Status: DC | PRN

## 2020-05-11 NOTE — MAU Note (Signed)
Patient presented to MAU with c/o contractions every 5-15 minutes since 8am today. Patient denies LOF or vaginal bleeding, patient feels fetal movement.

## 2020-05-11 NOTE — Anesthesia Preprocedure Evaluation (Signed)
Anesthesia Evaluation  Patient identified by MRN, date of birth, ID band Patient awake    Reviewed: Allergy & Precautions, Patient's Chart, lab work & pertinent test results  Airway Mallampati: II  TM Distance: >3 FB Neck ROM: Full    Dental no notable dental hx. (+) Teeth Intact   Pulmonary former smoker,    Pulmonary exam normal breath sounds clear to auscultation       Cardiovascular negative cardio ROS Normal cardiovascular exam Rhythm:Regular Rate:Normal     Neuro/Psych negative neurological ROS  negative psych ROS   GI/Hepatic Neg liver ROS, GERD  ,  Endo/Other  Obesity  Renal/GU negative Renal ROS  negative genitourinary   Musculoskeletal negative musculoskeletal ROS (+)   Abdominal (+) + obese,   Peds  Hematology  (+) anemia , Alpha thalassemia carrier   Anesthesia Other Findings   Reproductive/Obstetrics                             Anesthesia Physical Anesthesia Plan  ASA: II  Anesthesia Plan: Epidural   Post-op Pain Management:    Induction:   PONV Risk Score and Plan:   Airway Management Planned: Natural Airway  Additional Equipment:   Intra-op Plan:   Post-operative Plan:   Informed Consent: I have reviewed the patients History and Physical, chart, labs and discussed the procedure including the risks, benefits and alternatives for the proposed anesthesia with the patient or authorized representative who has indicated his/her understanding and acceptance.       Plan Discussed with: Anesthesiologist  Anesthesia Plan Comments:         Anesthesia Quick Evaluation

## 2020-05-11 NOTE — Progress Notes (Signed)
None      S: Ms. Alexxus Sobh Mcevoy is a 19 y.o. G1P0 at [redacted]w[redacted]d  who presents to MAU today complaining contractions q 5-15 minutes since 0800. She denies vaginal bleeding. She denies LOF. She reports normal fetal movement.    O: BP 122/82 (BP Location: Right Arm)   Pulse 81   Temp 98.6 F (37 C) (Oral)   Resp 18   LMP 08/01/2019   SpO2 99%   Cervical exam:  Dilation: 1 Effacement (%): 50 Cervical Position: Posterior Station: Ballotable Presentation: Undeterminable Exam by:: n druebbisch rn   Fetal Monitoring: Baseline: 125 Variability: moderate Accelerations: 15x15 Decelerations: none Contractions: q3-73m   A: SIUP at [redacted]w[redacted]d  False labor vs latent labor NST reactive Vitals WNL GBS neg IOL later this week if no SOL prior  P: Discharge home with strict return precautions  Future Appointments  Date Time Provider Department Center  05/12/2020  9:45 AM [facility]ENING [facility] None  05/14/2020  6:30 AM [facility]-LD SCHED ROOM [facility] None    Joselyn Arrow, MD 05/11/2020 11:47 AM

## 2020-05-11 NOTE — MAU Note (Signed)
I have communicated with Dr. Mirian Mo and reviewed vital signs:  Vitals:   05/11/20 1147 05/11/20 1149  BP: 113/81   Pulse: 89   Resp: 18   Temp: 98.7 F (37.1 C)   SpO2:  99%    Vaginal exam:  Dilation: 1 Effacement (%): 50 Cervical Position: Posterior Station: Ballotable Presentation: Undeterminable Exam by:: n druebbisch rn,   Also reviewed contraction pattern and that non-stress test is reactive.  It has been documented that patient is contracting every 1.5-5 minutes with no cervical change over 1 hour not indicating active labor.  Patient denies any other complaints.  Based on this report provider has given order for discharge.  A discharge order and diagnosis entered by a provider.   Labor discharge instructions reviewed with patient. Patient is scheduled for her pre-admission Covid test tomorrow and IOL on 05/14/20.

## 2020-05-11 NOTE — H&P (Addendum)
OBSTETRIC ADMISSION HISTORY AND PHYSICAL  Joyce Hodge is a 19 y.o. female G1P0 with IUP at 48w4dby LMP presenting for spontaneous rupture of membranes at 1900. She reports +FMs, no VB, no blurry vision, headaches or peripheral edema, and RUQ pain.  She plans on breast and bottle feeding. She requests OCPs for birth control. [facility] --->  Estimated Date of Delivery: 05/07/20   Nursing Staff Provider  Office Location  [facility]  Dating  LMP  Language  English  Anatomy [place] normal  Flu Vaccine   10/01/2019 at her job Genetic Screen  NIPS: Low risks AFP: negative  TDaP vaccine   02/18/20 Hgb A1C or  GTT Early  Third trimester   Rhogam   n/a   LAB RESULTS   Feeding Plan Both  Blood Type O/Positive/-- (10/20 1412)   Contraception BCP Antibody Negative (10/20 1412)  Circumcision Yes if boy  Rubella 6.04 (10/20 1412)  [facility]  RPR Non Reactive (02/22 1017)   Support Person FOB  HBsAg Negative (10/20 1412)   Prenatal Classes No  HIV Non Reactive (02/22 1017)  BTL Consent  GBS Negative/-- (04/13 0237)(For PCN allergy, check sensitivities)   VBAC Consent  Pap  n/a 18yo    Hgb Electro  normal  BP Cuff ordered 10/09/2019 CF neg    SMA 3 copies    Waterbirth  '[ ]'  Class '[ ]'  Consent '[ ]'  CNM visit      Prenatal History/Complications: Normal first teen pregnancy, alpha thalassemia silent carrier   Past Medical History: Past Medical History:  Diagnosis Date   Medical history non-contributory    UTI (urinary tract infection)     Past Surgical History: Past Surgical History:  Procedure Laterality Date   NO PAST SURGERIES      Obstetrical History: OB History     Gravida  1   Para      Term      Preterm      AB      Living  0      SAB      TAB      Ectopic      Multiple      Live Births              Social History Social History   Socioeconomic History   Marital status: Single   Spouse name: Not on file   Number of children: Not on file   Years of education: Not on file   Highest education level: Not on file  Occupational History   Not on file  Tobacco Use   Smoking status: Former Smoker    Packs/day: 2.00    Types: Cigars    Quit date: 08/13/2019    Years since quitting: 0.7   Smokeless tobacco: Never Used  Substance and Sexual Activity   Alcohol use: No   Drug use: Never   Sexual activity: Yes    Birth control/protection: None  Other Topics Concern   Not on file  Social History Narrative   Not on file   Social Determinants of Health   Financial Resource Strain:    Difficulty of Paying Living Expenses:   Food Insecurity:    Worried About REstate manager/land agentof Food in the Last Year:    RRattanin the Last Year:   Transportation Needs:    LFilm/video editor(Medical):  Lack of Transportation (Non-Medical):   Physical Activity:    Days of Exercise per Week:    Minutes of Exercise per Session:   Stress:    Feeling of Stress :   Social Connections:    Frequency of Communication with Friends and Family:    Frequency of Social Gatherings with Friends and Family:    Attends Religious Services:    Active Member of Clubs or Organizations:    Attends Music therapist:    Marital Status:     Family History: Family History  Problem Relation Age of Onset   Healthy Mother    Healthy Father     Allergies: No Known Allergies  Medications Prior to Admission  Medication Sig Dispense Refill Last Dose   Prenatal Vit-Fe Phos-FA-Omega (VITAFOL GUMMIES) 3.33-0.333-34.8 MG CHEW Chew 1 tablet by mouth daily. 90 tablet 5 Past Week at Unknown time   Blood Pressure Monitoring (BLOOD PRESSURE KIT) DEVI 1 Device by Does not apply route daily. 1 Device 0    sodium chloride (OCEAN) 0.65 % SOLN nasal spray Place 1 spray into both nostrils as needed for congestion.        Review of Systems   All systems reviewed and negative except as  stated in HPI  Blood pressure 118/73, pulse 89, temperature 98.3 F (36.8 C), temperature source Oral, resp. rate 17, height '5\' 2"'  (1.575 m), weight 83.4 kg, last menstrual period 08/01/2019. General appearance: alert and no distress Lungs: clear to auscultation bilaterally Heart: regular rate and rhythm Abdomen: soft, non-tender, Gravid, FH appears appropriate Extremities: Homans sign is negative, no sign of DVT, no clonus, no edema Pelvic: not examined Presentation:  vertex by Leopold's Fetal monitoringBaseline: 135 bpm, Variability: moderate, Accelerations: 15 x 15 and Decelerations: Absent Uterine activity Ctx q51mn Dilation: 3.5 Effacement (%): 70 Station: -2 Exam by:: LFatima Blank CNM   Prenatal labs: ABO, Rh: O/Positive/-- (10/20 1412) Antibody: Negative (10/20 1412) Rubella: 6.04 (10/20 1412) RPR: Non Reactive (02/22 1017)  HBsAg: Negative (10/20 1412)  HIV: Non Reactive (02/22 1017)  GBS: Negative/-- (04/13 0237)  2 hr Glucola 136 Genetic screening Negative Anatomy [place] 12/13/2019  Prenatal Transfer Tool  Maternal Diabetes: No Genetic Screening: Normal Maternal Ultrasounds/Referrals: Normal Fetal Ultrasounds or other Referrals:  None Maternal Substance Abuse:  No Significant Maternal Medications:  None Significant Maternal Lab Results: Group B Strep negative  Results for orders placed or performed during the hospital encounter of 05/11/20 (from the past 24 hour(s))  POCT fern test   Collection Time: 05/11/20  8:14 PM  Result Value Ref Range   POCT Fern Test Positive = ruptured amniotic membanes     Patient Active Problem List   Diagnosis Date Noted   Alpha thalassemia silent carrier 10/30/2019   Supervision of normal first teen pregnancy 10/09/2019    Assessment/Plan:  Joyce Hodge is a 19y.o. G1P0 at 446w4dated by LMP here for spontaneous rupture of membranes at 1700.  #Labor: Admit to [facility] Routine Labor and Delivery  Orders per protocol.  Patient has progressed throughout the day.  Has no questions at this time.  Will hold off on augmentation at this time. #Pain: Epidural in place #FWB: NST reactive #ID:  GBS negative #MOF: Both #MOC: OCPs #Circ:  Girl, N/A  BaCleophas DunkerDO  05/11/2020, 8:30 PM   Attestation of Supervision of Student:  I confirm that I have verified the information documented in the  resident  student's note and  that I have also personally reperformed the history, physical exam and all medical decision making activities.  I have verified that all services and findings are accurately documented in this student's note; and I agree with management and plan as outlined in the documentation. I have also made any necessary editorial changes.  Wende Mott, [place] for [employer] 05/11/2020 10:03 PM

## 2020-05-11 NOTE — Anesthesia Procedure Notes (Signed)
Epidural Patient location during procedure: OB Start time: 05/11/2020 9:19 PM End time: 05/11/2020 9:26 PM  Staffing Anesthesiologist: Mal Amabile, MD Performed: anesthesiologist   Preanesthetic Checklist Completed: patient identified, IV checked, site marked, risks and benefits discussed, surgical consent, monitors and equipment checked, pre-op evaluation and timeout performed  Epidural Patient position: sitting Prep: DuraPrep and site prepped and draped Patient monitoring: continuous pulse ox and blood pressure Approach: midline Location: L3-L4 Injection technique: LOR air  Needle:  Needle type: Tuohy  Needle gauge: 17 G Needle length: 9 cm and 9 Needle insertion depth: 5 cm cm Catheter type: closed end flexible Catheter size: 19 Gauge Catheter at skin depth: 10 cm Test dose: negative and Other  Assessment Events: blood not aspirated, injection not painful, no injection resistance, no paresthesia and negative IV test  Additional Notes Patient identified. Risks and benefits discussed including failed block, incomplete  Pain control, post dural puncture headache, nerve damage, paralysis, blood pressure Changes, nausea, vomiting, reactions to medications-both toxic and allergic and post Partum back pain. All questions were answered. Patient expressed understanding and wished to proceed. Sterile technique was used throughout procedure. Epidural site was Dressed with sterile barrier dressing. No paresthesias, signs of intravascular injection Or signs of intrathecal spread were encountered.  Patient was more comfortable after the epidural was dosed. Please see RN's note for documentation of vital signs and FHR which are stable. Reason for block:procedure for pain

## 2020-05-11 NOTE — MAU Note (Signed)
. .  Joyce Hodge is a 19 y.o. at [redacted]w[redacted]d here in MAU reporting: LOF that started at 64. Pt reports ctx every 5 min.   Pain score: 6/10 Vitals:   05/11/20 1914  BP: 125/74  Pulse: (!) 101  Resp: 17  Temp: 98.3 F (36.8 C)

## 2020-05-12 ENCOUNTER — Other Ambulatory Visit (HOSPITAL_COMMUNITY): Admission: RE | Admit: 2020-05-12 | Payer: Medicaid Other | Source: Ambulatory Visit

## 2020-05-12 ENCOUNTER — Encounter (HOSPITAL_COMMUNITY): Payer: Self-pay | Admitting: Obstetrics & Gynecology

## 2020-05-12 DIAGNOSIS — Z3A40 40 weeks gestation of pregnancy: Secondary | ICD-10-CM

## 2020-05-12 LAB — RPR: RPR Ser Ql: NONREACTIVE

## 2020-05-12 MED ORDER — OXYTOCIN 40 UNITS IN NORMAL SALINE INFUSION - SIMPLE MED
1.0000 m[IU]/min | INTRAVENOUS | Status: DC
  Administered 2020-05-12: 2 m[IU]/min via INTRAVENOUS

## 2020-05-12 MED ORDER — SENNOSIDES-DOCUSATE SODIUM 8.6-50 MG PO TABS
2.0000 | ORAL_TABLET | ORAL | Status: DC
  Administered 2020-05-13 (×2): 2 via ORAL
  Filled 2020-05-12 (×2): qty 2

## 2020-05-12 MED ORDER — ACETAMINOPHEN 325 MG PO TABS
650.0000 mg | ORAL_TABLET | ORAL | Status: DC | PRN
  Administered 2020-05-12 – 2020-05-14 (×4): 650 mg via ORAL
  Filled 2020-05-12 (×4): qty 2

## 2020-05-12 MED ORDER — TERBUTALINE SULFATE 1 MG/ML IJ SOLN: 0.2500 mg | Freq: Once | INTRAMUSCULAR | Status: DC | PRN

## 2020-05-12 MED ORDER — TETANUS-DIPHTH-ACELL PERTUSSIS 5-2.5-18.5 LF-MCG/0.5 IM SUSP: 0.5000 mL | Freq: Once | INTRAMUSCULAR | Status: DC

## 2020-05-12 MED ORDER — WITCH HAZEL-GLYCERIN EX PADS: 1.0000 "application " | MEDICATED_PAD | CUTANEOUS | Status: DC | PRN

## 2020-05-12 MED ORDER — DIBUCAINE (PERIANAL) 1 % EX OINT: 1.0000 "application " | TOPICAL_OINTMENT | CUTANEOUS | Status: DC | PRN

## 2020-05-12 MED ORDER — SIMETHICONE 80 MG PO CHEW: 80.0000 mg | CHEWABLE_TABLET | ORAL | Status: DC | PRN

## 2020-05-12 MED ORDER — ONDANSETRON HCL 4 MG PO TABS: 4.0000 mg | ORAL_TABLET | ORAL | Status: DC | PRN

## 2020-05-12 MED ORDER — COCONUT OIL OIL: 1.0000 "application " | TOPICAL_OIL | Status: DC | PRN

## 2020-05-12 MED ORDER — DIPHENHYDRAMINE HCL 25 MG PO CAPS: 25.0000 mg | ORAL_CAPSULE | Freq: Four times a day (QID) | ORAL | Status: DC | PRN

## 2020-05-12 MED ORDER — IBUPROFEN 600 MG PO TABS
600.0000 mg | ORAL_TABLET | Freq: Four times a day (QID) | ORAL | Status: DC
  Administered 2020-05-12 – 2020-05-14 (×8): 600 mg via ORAL
  Filled 2020-05-12 (×8): qty 1

## 2020-05-12 MED ORDER — PRENATAL MULTIVITAMIN CH
1.0000 | ORAL_TABLET | Freq: Every day | ORAL | Status: DC
  Administered 2020-05-13 – 2020-05-14 (×2): 1 via ORAL
  Filled 2020-05-12 (×2): qty 1

## 2020-05-12 MED ORDER — ONDANSETRON HCL 4 MG/2ML IJ SOLN: 4.0000 mg | INTRAMUSCULAR | Status: DC | PRN

## 2020-05-12 MED ORDER — BENZOCAINE-MENTHOL 20-0.5 % EX AERO
1.0000 "application " | INHALATION_SPRAY | CUTANEOUS | Status: DC | PRN
  Administered 2020-05-12: 1 via TOPICAL
  Filled 2020-05-12: qty 56

## 2020-05-12 NOTE — Progress Notes (Signed)
Labor Progress Note Sedra A Lieser is a 19 y.o. G1P0 at [redacted]w[redacted]d presented for spotaneous rupture of membranes at 73  S: Patient not feeling contractions.  O:  BP 114/88   Pulse (!) 297   Temp 98.6 F (37 C) (Oral)   Resp 16   Ht 5\' 2"  (1.575 m)   Wt 83.4 kg   LMP 08/01/2019   SpO2 100%   BMI 33.63 kg/m  EFM: 130/moderate variability/+accels, late decel that resolved with position change  CVE: Dilation: 9 Effacement (%): 100 Station: Plus 1 Presentation: Vertex Exam by:: Bre Price RN   A&P: 19 y.o. G1P0 [redacted]w[redacted]d here with spontaneous ROM #Labor: Progressing well. Will not recheck at this time as is not feeling contractions.  Will continue to manage expectantly. #Pain: Epidural #FWB: category II #GBS negative  [redacted]w[redacted]d Jami Bogdanski, DO 6:10 AM

## 2020-05-12 NOTE — Progress Notes (Signed)
Labor Progress Note Joyce Hodge is a 19 y.o. G1P0 at [redacted]w[redacted]d presented for spotaneous rupture of membranes at 1900.  S: Feeling more pressure and contractions.  O:  BP (!) 111/53   Pulse 71   Temp 98 F (36.7 C) (Axillary)   Resp 16   Ht 5\' 2"  (1.575 m)   Wt 83.4 kg   LMP 08/01/2019   SpO2 100%   BMI 33.63 kg/m  EFM: 130/moderate variability/+ accels, early decels  CVE: 8/100/+1  A&P: 19 y.o. G1P0 [redacted]w[redacted]d here with spontaneous ROM. #Labor: Progressing very well.  Will continue with current plan of labor and recheck in 3 hrs.  Encouraged patient to call out of pressure becomes constant. #Pain: Epidural #FWB: category I #GBS negative  [redacted]w[redacted]d [name], DO 4:11 AM

## 2020-05-12 NOTE — Anesthesia Postprocedure Evaluation (Signed)
Anesthesia Post Note  Patient: Joyce Hodge  Procedure(s) Performed: AN AD HOC LABOR EPIDURAL     Patient location during evaluation: Mother Baby Anesthesia Type: Epidural Level of consciousness: awake Pain management: satisfactory to patient Vital Signs Assessment: post-procedure vital signs reviewed and stable Respiratory status: spontaneous breathing Cardiovascular status: stable Anesthetic complications: no    Last Vitals:  Vitals:   05/12/20 1442 05/12/20 1545  BP: 97/71 118/71  Pulse: 79 86  Resp: 18 18  Temp: 37.2 C 36.9 C  SpO2: 100% 100%    Last Pain:  Vitals:   05/12/20 1727  TempSrc:   PainSc: 4    Pain Goal: Patients Stated Pain Goal: 4 (05/12/20 1123)                 Cephus Shelling

## 2020-05-12 NOTE — Discharge Summary (Signed)
Postpartum Discharge Summary     Patient Name: Joyce Hodge DOB: 04/25/2001 MRN: [id number]  Date of admission: 05/11/2020 Delivery date:05/12/2020  Delivering provider: Julianne Handler  Date of discharge: 05/14/2020  Admitting diagnosis: Labor and delivery, indication for care [O75.9] Intrauterine pregnancy: [redacted]w[redacted]d    Secondary diagnosis:  Active Problems:   Supervision of normal first teen pregnancy   Labor and delivery, indication for care   SVD (spontaneous vaginal delivery)  Additional problems: teen pregnancy, alpha thalassemia silent carrier    Discharge diagnosis: Term Pregnancy Delivered                                              Post partum procedures:none Augmentation: Pitocin Complications: None  Hospital course: Onset of Labor With Vaginal Delivery      19y.o. yo G1P0 at 446w5das admitted in Latent Labor with SROM on 05/11/2020. Patient had an uncomplicated labor course as follows:  Membrane Rupture Time/Date: 7:00 PM ,05/11/2020   Delivery Method:Vaginal, Spontaneous  Episiotomy: None  Lacerations:  Labial , left Patient had an uncomplicated postpartum course.  She is ambulating, tolerating a regular diet, passing flatus, and urinating well. Patient is discharged home in stable condition on 05/14/20.  Newborn Data: Birth date:05/12/2020  Birth time:12:43 PM  Gender:Female  Living status:Living  Apgars:8 ,9  Weight:3189 g   Magnesium Sulfate received: No BMZ received: No Rhophylac:N/A MMR:N/A T-DaP:Given prenatally Flu: Yes Transfusion:No  Physical exam  Vitals:   05/13/20 0421 05/13/20 1319 05/13/20 2233 05/14/20 0519  BP: 106/74 103/71 110/81 112/78  Pulse: 88 65 68 72  Resp: '19 18 16 18  ' Temp: 97.8 F (36.6 C) 98.4 F (36.9 C) 98.5 F (36.9 C) 98.1 F (36.7 C)  TempSrc: Oral Oral Oral Oral  SpO2: 99% 98%  100%  Weight:      Height:       General: alert, cooperative and no distress Lochia: appropriate Uterine Fundus: firm Incision:  N/A DVT Evaluation: No evidence of DVT seen on physical exam. No significant calf/ankle edema. Labs: Lab Results  Component Value Date   WBC 8.0 05/11/2020   HGB 11.4 (L) 05/11/2020   HCT 35.9 (L) 05/11/2020   MCV 87.3 05/11/2020   PLT 191 05/11/2020   No flowsheet data found. Edinburgh Score: No flowsheet data found.   After visit meds:  Allergies as of 05/14/2020   No Known Allergies     Medication List    TAKE these medications   acetaminophen 325 MG tablet Commonly known as: Tylenol Take 2 tablets (650 mg total) by mouth every 4 (four) hours as needed (for pain scale < 4).   Blood Pressure Kit Devi 1 Device by Does not apply route daily.   ibuprofen 600 MG tablet Commonly known as: ADVIL Take 1 tablet (600 mg total) by mouth every 6 (six) hours.   norethindrone 0.35 MG tablet Commonly known as: Ortho Micronor Take 1 tablet (0.35 mg total) by mouth daily.   polyethylene glycol powder 17 GM/SCOOP powder Commonly known as: GLYCOLAX/MIRALAX Take 17 g by mouth daily as needed.   sodium chloride 0.65 % Soln nasal spray Commonly known as: OCEAN Place 1 spray into both nostrils as needed for congestion.   Vitafol Gummies 3.33-0.333-34.8 MG Chew Chew 1 tablet by mouth daily.      Discharge home in stable condition Infant Feeding: Bottle  and Breast Infant Disposition:home with mother Discharge instruction: per After Visit Summary and Postpartum booklet. Activity: Advance as tolerated. Pelvic rest for 6 weeks.  Diet: routine diet Future Appointments: Future Appointments  Date Time Provider [name]  06/10/2020 11:15 AM Lajean Manes, CNM [place] None   Follow up Visit:   Please schedule this patient for a Virtual postpartum visit in 6 weeks with the following provider: Any provider. Additional Postpartum F/U:n/a  Low risk pregnancy complicated by: n/a Delivery mode:  Vaginal, Spontaneous  Anticipated Birth Control:   POPs   05/14/2020 Clarnce Flock, MD

## 2020-05-12 NOTE — Progress Notes (Signed)
Labor Progress Note Joyce Hodge is a 19 y.o. G1P0 at [redacted]w[redacted]d presented for spotaneous rupture of membranes at 1900.     S: Patient reporting some tightening with contractions.  O:  BP 106/60   Pulse 90   Temp 98.6 F (37 C) (Oral)   Resp 17   Ht 5\' 2"  (1.575 m)   Wt 83.4 kg   LMP 08/01/2019   SpO2 100%   BMI 33.63 kg/m  EFM: 150/moderate variability/+ accels, no decels  CVE: Dilation: 4.5 Effacement (%): 100 Station: -1 Presentation: Vertex Exam by:: [email or web address] CNM   A&P: 19 y.o. G1P0 [redacted]w[redacted]d dated by LMP who presented with spontaneous rupture of membranes. #Labor: Progressing well. Discussed risks and benefits of labor augmentation with the patient.  She would like to wait on augmentation at this time.  Repositioned patient to left lateral decub with peanut ball.  Will recheck in 3 hrs. #Pain: Epidural in place #FWB: Category I tracing #GBS negative  [redacted]w[redacted]d Joyce Grandpre, DO 1:22 AM

## 2020-05-12 NOTE — Progress Notes (Signed)
Labor Progress Note Joyce Hodge is a 19 y.o. G1P0 at [redacted]w[redacted]d presented for labor  S:  Comfortable with epidural, feel some rectal pressure. Had nausea and got Zofran.  O:  BP 121/77   Pulse (!) 104   Temp 99.1 F (37.3 C) (Oral)   Resp 18   Ht 5\' 2"  (1.575 m)   Wt 83.4 kg   LMP 08/01/2019   SpO2 100%   BMI 33.63 kg/m  EFM: baseline 140 bpm/ mod variability/ + accels/ variable decels  Toco/IUPC: 2-4 SVE: Dilation: 9 Effacement (%): 100 Cervical Position: Anterior Station: 0 Presentation: Vertex Exam by:: [email or web address], CNM  A/P: 19 y.o. G1P0 [redacted]w[redacted]d  1. Labor: active, protracted 2. FWB: Cat II 3. Pain: epidural  Protracted labor, LOP position, IUPC inserted. Will start Pitocin and position left exaggerated sims. Anticipate labor progress and SVD.  [redacted]w[redacted]d, CNM 9:12 AM

## 2020-05-12 NOTE — Lactation Note (Signed)
This note was copied from a baby's chart. Lactation Consultation Note Baby 10 hrs old. Mom is br/formula feeding. RN informed LC mom wasn't really wanting to BF, she was going to "try" it but wasn't really excited about nor did she want to feel pressured into it. LC spoke w/mom telling her LC would help her Bf if that is what she wanted to do, if she didn't then that was fine just let RN know to tell us not to see her. Mom stated to [facility] she wants to do both, it hurts and was uncomfortable.  Encouraged mom to call for [facility] assistance for next feeding and would assist in latching. Newborn feeding habits reviewed. Mom eating supper. Encouraged mom to rest while baby was resting, wake baby for feedings if hasn't cued in 3 hrs. STS and I&O important. Mom has short shaft nipples flattens when compressed. compressible pulls out w/stimulation. Call for assistance as needed. Lactation brochure given.  Patient Name: Joyce Hodge Today's Date: 05/12/2020 Reason for consult: Initial assessment;Primapara;Term   Maternal Data Has patient been taught Hand Expression?: Yes Does the patient have breastfeeding experience prior to this delivery?: No  Feeding Feeding Type: Bottle Fed - Formula Nipple Type: Slow - flow  LATCH Score Latch: Repeated attempts needed to sustain latch, nipple held in mouth throughout feeding, stimulation needed to elicit sucking reflex.  Audible Swallowing: A few with stimulation  Type of Nipple: Everted at rest and after stimulation(short shaft)  Comfort (Breast/Nipple): Soft / non-tender  Hold (Positioning): Assistance needed to correctly position infant at breast and maintain latch.  LATCH Score: 6  Interventions Interventions: Breast feeding basics reviewed;Hand express;Breast compression;Position options  Lactation Tools Discussed/Used Tools: Nipple Shields Nipple shield size: 20 WIC Program: Yes   Consult Status Consult Status: Follow-up Date:  05/13/20 Follow-up type: In-patient    Charyl Dancer 05/12/2020, 10:54 PM

## 2020-05-13 NOTE — Progress Notes (Signed)
POSTPARTUM PROGRESS NOTE  Post Partum Day 1  Subjective:  Joyce Hodge is a 19 y.o. G1P1001 s/p NSVD at [redacted]w[redacted]d.  She reports she is doing well. No acute events overnight. She denies any problems with ambulating, voiding or po intake. Denies nausea or vomiting.  Pain is well controlled.  Lochia is appropriate.  Objective: Blood pressure 106/74, pulse 88, temperature 97.8 F (36.6 C), temperature source Oral, resp. rate 19, height 5\' 2"  (1.575 m), weight 83.4 kg, last menstrual period 08/01/2019, SpO2 99 %, unknown if currently breastfeeding.  Physical Exam:  General: alert, cooperative and no distress Chest: no respiratory distress Heart:regular rate, distal pulses intact Abdomen: soft, nontender,  Uterine Fundus: firm, appropriately tender DVT Evaluation: No calf swelling or tenderness Extremities: no LE edema Skin: warm, dry  Recent Labs    05/11/20 2026  HGB 11.4*  HCT 35.9*    Assessment/Plan: Joyce Hodge is a 19 y.o. G1P1001 s/p NSVD at [redacted]w[redacted]d   PPD#1 - Doing well  Routine postpartum care Contraception: POPs Feeding: breast and bottle Dispo: Plan for discharge POD#1-2, pending baby.   LOS: 2 days   [redacted]w[redacted]d, MD/MP[facility] OB Fellow  05/13/2020, 7:32 AM

## 2020-05-14 ENCOUNTER — Inpatient Hospital Stay (HOSPITAL_COMMUNITY)
Admission: AD | Admit: 2020-05-14 | Payer: Medicaid Other | Source: Home / Self Care | Admitting: Obstetrics & Gynecology

## 2020-05-14 ENCOUNTER — Inpatient Hospital Stay (HOSPITAL_COMMUNITY): Payer: Medicaid Other

## 2020-05-14 MED ORDER — NORETHINDRONE 0.35 MG PO TABS
1.0000 | ORAL_TABLET | Freq: Every day | ORAL | 11 refills | Status: DC
Start: 2020-05-14 — End: 2020-06-17

## 2020-05-14 MED ORDER — ACETAMINOPHEN 325 MG PO TABS: 650.0000 mg | ORAL_TABLET | ORAL | 0 refills | Status: DC | PRN

## 2020-05-14 MED ORDER — IBUPROFEN 600 MG PO TABS: 600.0000 mg | ORAL_TABLET | Freq: Four times a day (QID) | ORAL | 0 refills | Status: DC

## 2020-05-14 MED ORDER — POLYETHYLENE GLYCOL 3350 17 GM/SCOOP PO POWD: 17.0000 g | Freq: Every day | ORAL | 1 refills | Status: DC | PRN

## 2020-05-14 NOTE — Lactation Note (Signed)
This note was copied from a baby's chart. Lactation Consultation Note  Patient Name: Girl Raffaella Vanwinkle Today's Date: 05/14/2020    Mother is a P1, infant is 47  hours . Mother was given [facility] brochure and basic teaching done.   Staff nurse reports that she would like for LC to see mother she reports she was unsure of mother goals since she came in wanting to breastfeed.  Mother reports that she did have questions about breastfeeding. She reports that she plans to try to breastfeed infant when she gets home. Lots of teaching done with mother and father and all questions answered.   Reviewed hand expression with mother. Observed large drops of colostrum. Mother has a  harmony hand pump at the bedside. She also has a DEBP. Mother purchased a NVIE pump for home use.    She is active with WIC .   Mother was given shells. Mothers nipple tissue firms slightly when stimulated. She was taught to use her hands .   She was fit with a #24 NS and advised in the proper use. Mother plans to attempt to breastfeed infant on the bare breast and if unable to she will try the NS. Mother encouraged to phone [facility] office for questions or concerns.   Plan of Care : Breastfeed infant with feeding cues Supplement infant with ebm/formula, according to supplemental guidelines. Pump using a DEBP after each feeding for 15-20 mins.  Discussed treatment and prevention of engorgement.   Mother very receptive of all teaching and appreciative . Father at the bedside and supportive.  Mother to continue to cue base feed infant and feed at least 8-12 times or more in 24 hours and advised to allow for cluster feeding infant as needed.   Mother to continue to due STS. Mother is aware of available LC services at [facility], BFSG'S, OP Dept, and phone # for questions or concerns about breastfeeding.  Mother receptive to all teaching and plan of care.     Maternal Data    Feeding Feeding Type: Bottle Fed - Formula  LATCH Score                    Interventions    Lactation Tools Discussed/Used     Consult Status      Michel Bickers 05/14/2020, 1:11 PM

## 2020-05-14 NOTE — Discharge Instructions (Signed)

## 2020-05-14 NOTE — Progress Notes (Signed)
CSW received consult due to score 10 on Edinburgh Depression Screen.    CSW congratulated MOB on the birth of infant. CSW advised MOB of CSW's role and the reason for CSW coming to visit with her. MOB reported that for that last 7 days she has just had any things taking place. MOB reported that she is preparing to move. MOB also informed CSW that she was placed on maternity leave from work. MOB verbalized to CSW that last night with infant was a little challenging as "we didn't get much sleep, but I am so excited to be a mom".  MOB reported that she has been feeling very happy since she gave birth. MOB reported to CSW that she has no mental health hx that she is aware of. MOB denied ever being on medications or being in therapy, MOB also reported no desire for these resources at this time time. MOB reported no signs or feelings of SI or HI.   MOB reported to CSW that she needed assistance with getting WIC set up for infant. CSW assisted MOB in getting this done. MOB was advised that her [facility] appointment is on May 16, 2020 at 8am with [facility]. MOB inquired from CSW on how to get Medicaid for infant. CSW reached out to Financial Counselor to gather more details on this for MOB at this time.   CSW provided education regarding SIDS and PPD. CSW encouraged MOB to evaluate her mental health throughout the postpartum period with the use of the New Mom Checklist developed by Postpartum Progress. MOB was advised of resources that she can reach out to in the event that assistance is needed.       Joyce Hodge, MSW, LCSW [facility] [place] [phone]

## 2020-06-10 ENCOUNTER — Telehealth: Payer: Medicaid Other | Admitting: Certified Nurse Midwife

## 2020-06-17 ENCOUNTER — Telehealth (INDEPENDENT_AMBULATORY_CARE_PROVIDER_SITE_OTHER): Payer: Medicaid Other | Admitting: Obstetrics and Gynecology

## 2020-06-17 ENCOUNTER — Encounter: Payer: Self-pay | Admitting: Obstetrics and Gynecology

## 2020-06-17 DIAGNOSIS — Z309 Encounter for contraceptive management, unspecified: Secondary | ICD-10-CM | POA: Insufficient documentation

## 2020-06-17 DIAGNOSIS — Z5181 Encounter for therapeutic drug level monitoring: Secondary | ICD-10-CM | POA: Diagnosis not present

## 2020-06-17 DIAGNOSIS — Z30011 Encounter for initial prescription of contraceptive pills: Secondary | ICD-10-CM

## 2020-06-17 MED ORDER — NORETHIN ACE-ETH ESTRAD-FE 1-20 MG-MCG(24) PO TABS: 1.0000 | ORAL_TABLET | Freq: Every day | ORAL | 11 refills | Status: DC

## 2020-06-17 NOTE — Progress Notes (Signed)
     I connected with@ on 06/17/20 at  2:45 PM EDT by: Mychart video and verified that I am speaking with the correct person using two identifiers.  Patient is located at home and provider is located at [employer] for [employer] at [place].   The purpose of this virtual visit is to provide medical care while limiting exposure to the novel coronavirus. I discussed the limitations, risks, security and privacy concerns of performing an evaluation and management service by video and the availability of in person appointments. I also discussed with the patient that there may be a patient responsible charge related to this service. By engaging in this virtual visit, you consent to the provision of healthcare.  Additionally, you authorize for your insurance to be billed for the services provided during this visit.  The patient expressed understanding and agreed to proceed.  The following staff members participated in the virtual visit:    Post Partum Visit Note Subjective:   Roni A Tullis is a 19 y.o. G54P1001 female being evaluated for postpartum followup.  She is 5 weeks postpartum following a normal spontaneous vaginal delivery at [redacted]w[redacted]d gestational weeks.  I have fully reviewed the prenatal and intrapartum course; pregnancy complicated by unremarkable.  Postpartum course has been unremarkable. Baby is doing well. Baby is feeding by Daron Offer. Bleeding no bleeding. Bowel function is normal. Bladder function is normal. Patient is not sexually active. Contraception method is oral progesterone-only contraceptive. Postpartum depression screening: negative.  The following portions of the patient's history were reviewed and updated as appropriate: allergies, current medications, past family history, past medical history, past social history, past surgical history and problem list.  Review of Systems Pertinent items are noted in HPI.   Objective:   Vitals:   06/17/20 1431  BP: 125/81  Pulse:  98   Self-Obtained       Assessment:    Nl postpartum exam.  Contraception management  Plan:  Essential components of care per ACOG recommendations:  1.  Mood and well being: Patient with negative depression screening today. Reviewed local resources for support.  - Patient does not use tobacco. - hx of drug use? No    2. Infant care and feeding:  -Patient currently breastmilk feeding? No  -Social determinants of health (SDOH) reviewed in EPIC. No concerns  3. Sexuality, contraception and birth spacing - Patient does not want a pregnancy in the next year.  Desired family size is uncertain children.  - Reviewed forms of contraception in tiered fashion. Patient desired oral contraceptives (estrogen/progesterone) today.   - Discussed birth spacing of 18 months  4. Sleep and fatigue -Encouraged family/partner/community support of 4 hrs of uninterrupted sleep to help with mood and fatigue  5. Physical Recovery  - Discussed patients delivery. - Patient had a first degree laceration, perineal healing reviewed. Patient expressed understanding - Patient has urinary incontinence? No - Patient is safe to resume physical and sexual activity  6.  Health Maintenance  7.  No Chronic Disease - PCP follow up  10 minutes of non-face-to-face time spent with the patient    Dalphine Handing, [facility] for [employer], [facility]

## 2020-06-18 DIAGNOSIS — Z5181 Encounter for therapeutic drug level monitoring: Secondary | ICD-10-CM | POA: Diagnosis not present

## 2020-07-23 DIAGNOSIS — Z5181 Encounter for therapeutic drug level monitoring: Secondary | ICD-10-CM | POA: Diagnosis not present

## 2020-07-24 DIAGNOSIS — Z5181 Encounter for therapeutic drug level monitoring: Secondary | ICD-10-CM | POA: Diagnosis not present

## 2021-09-06 ENCOUNTER — Encounter (HOSPITAL_COMMUNITY): Payer: Self-pay

## 2021-09-06 ENCOUNTER — Emergency Department (HOSPITAL_COMMUNITY): Payer: Medicaid Other

## 2021-09-06 ENCOUNTER — Other Ambulatory Visit: Payer: Self-pay

## 2021-09-06 ENCOUNTER — Emergency Department (HOSPITAL_COMMUNITY)
Admission: EM | Admit: 2021-09-06 | Discharge: 2021-09-07 | Disposition: A | Discharge status: Home / Self Care | Payer: Medicaid Other | Attending: Emergency Medicine | Admitting: Emergency Medicine

## 2021-09-06 DIAGNOSIS — Z87891 Personal history of nicotine dependence: Secondary | ICD-10-CM | POA: Insufficient documentation

## 2021-09-06 DIAGNOSIS — H53149 Visual discomfort, unspecified: Secondary | ICD-10-CM | POA: Insufficient documentation

## 2021-09-06 DIAGNOSIS — R519 Headache, unspecified: Secondary | ICD-10-CM | POA: Diagnosis not present

## 2021-09-06 DIAGNOSIS — F40298 Other specified phobia: Secondary | ICD-10-CM | POA: Diagnosis not present

## 2021-09-06 LAB — I-STAT BETA HCG BLOOD, ED (MC, WL, AP ONLY): I-stat hCG, quantitative: <5 m[IU]/mL (ref <5)

## 2021-09-06 NOTE — ED Provider Notes (Signed)
Emergency Medicine Provider Triage Evaluation Note  Joyce Hodge , a 20 y.o. female  was evaluated in triage.  Pt complains of left-sided headache x3 weeks associated with left blurry vision. No head injury. No fever or chills. No history of migraines. She has been taking Excedrin and ibuprofen which relieves headache for a few hours then returns. No neck stiffness. No speech changes or unilateral weakness  Review of Systems  Positive: headache Negative: fever  Physical Exam  BP (!) 127/103 (BP Location: Left Arm)   Pulse 93   Temp 98.2 F (36.8 C) (Oral)   Resp 16   Ht 5\' 2"  (1.575 m)   Wt 79.8 kg   SpO2 100%   BMI 32.19 kg/m  Gen:   Awake, no distress   Resp:  Normal effort  MSK:   Moves extremities without difficulty  Other:    Medical Decision Making  Medically screening exam initiated at 9:49 PM.  Appropriate orders placed.  Joyce Hodge was informed that the remainder of the evaluation will be completed by another provider, this initial triage assessment does not replace that evaluation, and the importance of remaining in the ED until their evaluation is complete.  Prolonged headache>>CT head    09/06/21 2151    2152, MD 09/06/21 2337

## 2021-09-06 NOTE — ED Triage Notes (Signed)
Pt reports recurrent headache for several weeks.

## 2021-09-07 ENCOUNTER — Emergency Department (HOSPITAL_COMMUNITY): Payer: Medicaid Other

## 2021-09-07 DIAGNOSIS — R519 Headache, unspecified: Secondary | ICD-10-CM | POA: Diagnosis not present

## 2021-09-07 MED ORDER — DIPHENHYDRAMINE HCL 50 MG/ML IJ SOLN
12.5000 mg | Freq: Once | INTRAMUSCULAR | Status: AC
  Administered 2021-09-07: 12.5 mg via INTRAVENOUS
  Filled 2021-09-07: qty 1

## 2021-09-07 MED ORDER — PROCHLORPERAZINE EDISYLATE 10 MG/2ML IJ SOLN
10.0000 mg | Freq: Once | INTRAMUSCULAR | Status: AC
  Administered 2021-09-07: 10 mg via INTRAVENOUS
  Filled 2021-09-07: qty 2

## 2021-09-07 MED ORDER — DEXAMETHASONE SODIUM PHOSPHATE 10 MG/ML IJ SOLN
10.0000 mg | Freq: Once | INTRAMUSCULAR | Status: AC
  Administered 2021-09-07: 10 mg via INTRAVENOUS
  Filled 2021-09-07: qty 1

## 2021-09-07 MED ORDER — KETOROLAC TROMETHAMINE 30 MG/ML IJ SOLN
30.0000 mg | Freq: Once | INTRAMUSCULAR | Status: AC
  Administered 2021-09-07: 30 mg via INTRAVENOUS
  Filled 2021-09-07: qty 1

## 2021-09-07 MED ORDER — SODIUM CHLORIDE 0.9 % IV BOLUS
500.0000 mL | Freq: Once | INTRAVENOUS | Status: AC
  Administered 2021-09-07: 500 mL via INTRAVENOUS

## 2021-09-07 NOTE — ED Provider Notes (Signed)
[facility]-EMERGENCY DEPT Provider Note   CSN: [id number] Arrival date & time: 09/06/21  2112     History Chief Complaint  Patient presents with   Headache    Joyce Hodge is a 20 y.o. female presents to the ED with c/o headache ongoing for the last 3-4 weeks. Pt reports they started off variable and would go away with Tylenol.  She reports every headache is worse than the last. She reports headaches are now daily.  Headaches are worst early in the morning.  She reports sometimes they occur at night.  Pt reports she is now taking Excedrin for the headaches for the last week.  Reports the headache resolves with Excedrin but the headache always returns in 4-5 hours after taking the medication.  Denies nausea, vomiting, dizziness, syncope.  Pt reports when the headache happens she notices blurred vision in the left eye only.  Reports this always resolves with a headache.  Pt with photophobia and phonophobia when she has the headache, but they do not cause the headache.  Pt reports no headache right now as she took Excedrin at 5pm and tylenol at 9:30pm.  Denies migraine diagnosis.  No fhx of aneurysm.    The history is provided by the patient and medical records.      Past Medical History:  Diagnosis Date   Medical history non-contributory    UTI (urinary tract infection)     Patient Active Problem List   Diagnosis Date Noted   Postpartum examination following vaginal delivery 06/17/2020   Contraception management 06/17/2020   Alpha thalassemia silent carrier 10/30/2019    Past Surgical History:  Procedure Laterality Date   NO PAST SURGERIES       OB History     Gravida  1   Para  1   Term  1   Preterm      AB      Living  1      SAB      IAB      Ectopic      Multiple  0   Live Births  1           Family History  Problem Relation Age of Onset   Healthy Mother    Healthy Father     Social History   Tobacco Use    Smoking status: Former    Packs/day: 2.00    Types: Cigars, Cigarettes    Quit date: 08/13/2019    Years since quitting: 2.0   Smokeless tobacco: Never  Vaping Use   Vaping Use: Never used  Substance Use Topics   Alcohol use: No   Drug use: Never    Home Medications Prior to Admission medications   Medication Sig Start Date End Date Taking? Authorizing Provider  Norethindrone Acetate-Ethinyl Estrad-FE (LOESTRIN 24 FE) 1-20 MG-MCG(24) tablet Take 1 tablet by mouth daily. 06/17/20   Hermina Staggers, MD    Allergies    Patient has no known allergies.  Review of Systems   Review of Systems  Constitutional:  Negative for appetite change, diaphoresis, fatigue, fever and unexpected weight change.  HENT:  Negative for mouth sores.   Eyes:  Positive for photophobia and visual disturbance.  Respiratory:  Negative for cough, chest tightness, shortness of breath and wheezing.   Cardiovascular:  Negative for chest pain.  Gastrointestinal:  Negative for abdominal pain, constipation, diarrhea, nausea and vomiting.  Endocrine: Negative for polydipsia, polyphagia and polyuria.  Genitourinary:  Negative for dysuria, frequency, hematuria and urgency.  Musculoskeletal:  Negative for back pain and neck stiffness.  Skin:  Negative for rash.  Allergic/Immunologic: Negative for immunocompromised state.  Neurological:  Positive for headaches. Negative for syncope and light-headedness.  Hematological:  Does not bruise/bleed easily.  Psychiatric/Behavioral:  Negative for sleep disturbance. The patient is not nervous/anxious.    Physical Exam Updated Vital Signs BP 120/85   Pulse 84   Temp 98.2 F (36.8 C) (Oral)   Resp 15   Ht 5\' 2"  (1.575 m)   Wt 79.8 kg   SpO2 100%   BMI 32.19 kg/m   Physical Exam Vitals and nursing note reviewed.  Constitutional:      General: She is not in acute distress.    Appearance: She is well-developed. She is not diaphoretic.  HENT:     Head: Normocephalic  and atraumatic.     Nose: Nose normal.     Mouth/Throat:     Mouth: Mucous membranes are moist.  Eyes:     General: No scleral icterus.    Conjunctiva/sclera: Conjunctivae normal.     Pupils: Pupils are equal, round, and reactive to light.     Comments: No horizontal, vertical or rotational nystagmus  Neck:     Comments: Full active and passive ROM without pain No midline or paraspinal tenderness No nuchal rigidity or meningeal signs Cardiovascular:     Rate and Rhythm: Normal rate and regular rhythm.  Pulmonary:     Effort: Pulmonary effort is normal. No respiratory distress.     Breath sounds: No wheezing or rales.  Abdominal:     General: There is no distension.     Palpations: Abdomen is soft.     Tenderness: There is no abdominal tenderness. There is no guarding or rebound.  Musculoskeletal:        General: Normal range of motion.     Cervical back: Normal range of motion and neck supple.  Lymphadenopathy:     Cervical: No cervical adenopathy.  Skin:    General: Skin is warm and dry.     Findings: No rash.  Neurological:     Mental Status: She is alert and oriented to person, place, and time.     Cranial Nerves: No cranial nerve deficit.     Motor: No abnormal muscle tone.     Coordination: Coordination normal.     Comments: Mental Status:  Alert, oriented, thought content appropriate. Speech fluent without evidence of aphasia. Able to follow 2 step commands without difficulty.  Cranial Nerves:  II:  Peripheral visual fields grossly normal, pupils equal, round, reactive to light III,IV, VI: ptosis not present, extra-ocular motions intact bilaterally  V,VII: smile symmetric, facial light touch sensation equal VIII: hearing grossly normal bilaterally  IX,X: midline uvula rise  XI: bilateral shoulder shrug equal and strong XII: midline tongue extension  Motor:  5/5 in upper and lower extremities bilaterally including strong and equal grip strength and  dorsiflexion/plantar flexion Sensory: Pinprick and light touch normal in all extremities.  Cerebellar: normal finger-to-nose with bilateral upper extremities Gait: normal gait and balance CV: distal pulses palpable throughout   Psychiatric:        Behavior: Behavior normal.        Thought Content: Thought content normal.        Judgment: Judgment normal.    ED Results / Procedures / Treatments   Labs (all labs ordered are listed, but only abnormal results are displayed) Labs Reviewed  I-STAT BETA HCG BLOOD, ED ([facility], [facility], [facility] ONLY)     Radiology CT HEAD WO CONTRAST ( )  Result Date: 09/07/2021 CLINICAL DATA:  Headache. EXAM: CT HEAD WITHOUT CONTRAST TECHNIQUE: Contiguous axial images were obtained from the base of the skull through the vertex without intravenous contrast. COMPARISON:  None. FINDINGS: Brain: No evidence of acute infarction, hemorrhage, hydrocephalus, extra-axial collection or mass lesion/mass effect. Vascular: No hyperdense vessel or unexpected calcification. Skull: Normal. Negative for fracture or focal lesion. Sinuses/Orbits: No acute finding. Other: None IMPRESSION: Normal noncontrast CT of the brain. Electronically Signed   By: Elgie Collard M.D.   On: 09/07/2021 00:20    Procedures Procedures   Medications Ordered in ED Medications  sodium chloride 0.9 % bolus 500 mL (500 mLs Intravenous New Bag/Given 09/07/21 0102)  prochlorperazine (COMPAZINE) injection 10 mg (10 mg Intravenous Given 09/07/21 0103)  diphenhydrAMINE (BENADRYL) injection 12.5 mg (12.5 mg Intravenous Given 09/07/21 0102)  ketorolac (TORADOL) 30 MG/ML injection 30 mg (30 mg Intravenous Given 09/07/21 0102)  dexamethasone (DECADRON) injection 10 mg (10 mg Intravenous Given 09/07/21 0102)    ED Course  I have reviewed the triage vital signs and the nursing notes.  Pertinent labs & imaging results that were available during my care of the patient were reviewed by me and considered in my medical  decision making (see chart for details).    MDM Rules/Calculators/A&P                           Patient presents to the emergency department with recurrent headaches.  Normal neurologic exam.  CT scan reassuring.  No evidence of intracranial mass.  No recent traumas.  Patient treated as possible migraine headache and referred to neurology for further evaluation and work-up.  Discussed risk factors and reasons for returning to the emergency department.  Patient states understanding and is in agreement with the plan.    Final Clinical Impression(s) / ED Diagnoses Final diagnoses:  Frequent headaches    Rx / DC Orders ED Discharge Orders     None        Tassie Pollett, Boyd Kerbs 09/07/21 0134    Glynn Octave, MD 09/07/21 [phone]

## 2021-09-07 NOTE — Discharge Instructions (Signed)
1. Medications: Excedrin or Tylenol for headaches -do not exceed 4 g of Tylenol in 24 hours, usual home medications 2. Treatment: rest, drink plenty of fluids,  3. Follow Up: Please followup with neurology office for further evaluation of your headaches; Please return to the ER for new or worsening symptoms

## 2021-09-08 ENCOUNTER — Telehealth: Payer: Self-pay

## 2021-09-08 NOTE — Telephone Encounter (Signed)
Transition Care Management Unsuccessful Follow-up Telephone Call  Date of discharge and from where:  09/07/2021-[place]   Attempts:  1st Attempt  Reason for unsuccessful TCM follow-up call:  Left voice message

## 2021-09-09 NOTE — Telephone Encounter (Signed)
Transition Care Management Unsuccessful Follow-up Telephone Call  Date of discharge and from where:  09/07/2021-[place]   Attempts:  2nd Attempt  Reason for unsuccessful TCM follow-up call:  Left voice message

## 2021-09-10 DIAGNOSIS — N898 Other specified noninflammatory disorders of vagina: Secondary | ICD-10-CM | POA: Diagnosis not present

## 2021-09-10 DIAGNOSIS — R350 Frequency of micturition: Secondary | ICD-10-CM | POA: Diagnosis not present

## 2021-09-10 DIAGNOSIS — N390 Urinary tract infection, site not specified: Secondary | ICD-10-CM | POA: Diagnosis not present

## 2021-09-10 NOTE — Telephone Encounter (Signed)
Transition Care Management Unsuccessful Follow-up Telephone Call  Date of discharge and from where:  09/07/2021 from [facility] [place]  Attempts:  3rd Attempt  Reason for unsuccessful TCM follow-up call:  Unable to reach patient

## 2021-10-01 ENCOUNTER — Other Ambulatory Visit: Payer: Self-pay

## 2021-10-01 DIAGNOSIS — Z30011 Encounter for initial prescription of contraceptive pills: Secondary | ICD-10-CM

## 2021-10-01 MED ORDER — NORETHIN ACE-ETH ESTRAD-FE 1-20 MG-MCG(24) PO TABS
1.0000 | ORAL_TABLET | Freq: Every day | ORAL | 0 refills | Status: DC
Start: 2021-10-01 — End: 2021-10-06

## 2021-10-06 ENCOUNTER — Other Ambulatory Visit: Payer: Self-pay | Admitting: *Deleted

## 2021-10-06 DIAGNOSIS — Z30011 Encounter for initial prescription of contraceptive pills: Secondary | ICD-10-CM

## 2021-10-06 MED ORDER — NORETHIN ACE-ETH ESTRAD-FE 1-20 MG-MCG(24) PO TABS: 1.0000 | ORAL_TABLET | Freq: Every day | ORAL | 0 refills | Status: DC

## 2021-10-06 MED ORDER — NORETHIN ACE-ETH ESTRAD-FE 1-20 MG-MCG(24) PO TABS: 1.0000 | ORAL_TABLET | Freq: Every day | ORAL | 11 refills | Status: DC

## 2021-10-06 NOTE — Addendum Note (Signed)
Addended by: Pennie Banter on: 10/06/2021 02:46 PM   Modules accepted: Orders

## 2021-11-12 ENCOUNTER — Ambulatory Visit: Payer: Medicaid Other

## 2021-11-16 ENCOUNTER — Ambulatory Visit: Payer: Medicaid Other | Admitting: Obstetrics and Gynecology

## 2022-03-08 ENCOUNTER — Ambulatory Visit: Payer: Medicaid Other | Admitting: Obstetrics and Gynecology

## 2022-05-26 ENCOUNTER — Telehealth: Payer: Medicaid Other | Admitting: Family

## 2022-05-26 DIAGNOSIS — H65192 Other acute nonsuppurative otitis media, left ear: Secondary | ICD-10-CM | POA: Diagnosis not present

## 2022-05-26 MED ORDER — AMOXICILLIN 875 MG PO TABS: 875.0000 mg | ORAL_TABLET | Freq: Two times a day (BID) | ORAL | 0 refills | Status: AC

## 2022-05-26 MED ORDER — FLUTICASONE PROPIONATE 50 MCG/ACT NA SUSP: 2.0000 | Freq: Every day | NASAL | 6 refills | Status: DC

## 2022-05-26 MED ORDER — CETIRIZINE HCL 10 MG PO TABS: 10.0000 mg | ORAL_TABLET | Freq: Every day | ORAL | 2 refills | Status: DC

## 2022-05-26 NOTE — Progress Notes (Signed)
Virtual Visit Consent   Joyce Hodge, you are scheduled for a virtual visit with a [facility] provider today. Just as with appointments in the office, your consent must be obtained to participate. Your consent will be active for this visit and any virtual visit you may have with one of our providers in the next 365 days. If you have a MyChart account, a copy of this consent can be sent to you electronically.  As this is a virtual visit, video technology does not allow for your provider to perform a traditional examination. This may limit your provider's ability to fully assess your condition. If your provider identifies any concerns that need to be evaluated in person or the need to arrange testing (such as labs, EKG, etc.), we will make arrangements to do so. Although advances in technology are sophisticated, we cannot ensure that it will always work on either your end or our end. If the connection with a video visit is poor, the visit may have to be switched to a telephone visit. With either a video or telephone visit, we are not always able to ensure that we have a secure connection.  By engaging in this virtual visit, you consent to the provision of healthcare and authorize for your insurance to be billed (if applicable) for the services provided during this visit. Depending on your insurance coverage, you may receive a charge related to this service.  I need to obtain your verbal consent now. Are you willing to proceed with your visit today? Davona A Kocher has provided verbal consent on 05/26/2022 for a virtual visit (video or telephone). Jannifer Rodney, FNP  Date: 05/26/2022 4:19 PM  Virtual Visit via Video Note   I, Jannifer Rodney, connected with  SUDA FORBESS  ([id number], [birth year]) on 05/26/22 at  4:15 PM EDT by a video-enabled telemedicine application and verified that I am speaking with the correct person using two identifiers.  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 The patient expressed understanding and agreed to proceed.    History of Present Illness: Joyce Hodge is a 21 y.o. who identifies as a female who was assigned female at birth, and is being seen today for left ear pain that started last night.  HPI: Otalgia  There is pain in the left ear. This is a new problem. The current episode started yesterday. The problem occurs constantly. The problem has been unchanged. There has been no fever. The pain is at a severity of 7/10. The pain is moderate. Pertinent negatives include no coughing, diarrhea, headaches, hearing loss, rhinorrhea or sore throat. She has tried acetaminophen for the symptoms. The treatment provided mild relief.   Problems:  Patient Active Problem List   Diagnosis Date Noted   Postpartum examination following vaginal delivery 06/17/2020   Contraception management 06/17/2020   Alpha thalassemia silent carrier 10/30/2019    Allergies: No Known Allergies Medications:  Current Outpatient Medications:    amoxicillin (AMOXIL) 875 MG tablet, Take 1 tablet (875 mg total) by mouth 2 (two) times daily for 7 days., Disp: 14 tablet, Rfl: 0   cetirizine (ZYRTEC ALLERGY) 10 MG tablet, Take 1 tablet (10 mg total) by mouth daily., Disp: 30 tablet, Rfl: 2   fluticasone (FLONASE) 50 MCG/ACT nasal spray, Place 2 sprays into both nostrils daily., Disp: 16 g, Rfl: 6  Norethindrone Acetate-Ethinyl Estrad-FE (LOESTRIN 24 FE) 1-20 MG-MCG(24) tablet, Take 1 tablet by mouth daily., Disp: 28 tablet, Rfl: 11  Observations/Objective: Patient is well-developed, well-nourished in no acute distress.  Resting comfortably  at home.  Head is normocephalic, atraumatic.  No labored breathing.  Speech is clear and coherent with logical content.  Patient is alert and oriented at  baseline.    Assessment and Plan: 1. Other acute nonsuppurative otitis media of left ear, recurrence not specified - amoxicillin (AMOXIL) 875 MG tablet; Take 1 tablet (875 mg total) by mouth 2 (two) times daily for 7 days.  Dispense: 14 tablet; Refill: 0 - cetirizine (ZYRTEC ALLERGY) 10 MG tablet; Take 1 tablet (10 mg total) by mouth daily.  Dispense: 30 tablet; Refill: 2 - fluticasone (FLONASE) 50 MCG/ACT nasal spray; Place 2 sprays into both nostrils daily.  Dispense: 16 g; Refill: 6  Start zyrtec and flonase  Take tylenol  Force fluids If no improvement, start amoxicillin tomorrow.   Follow Up Instructions: I discussed the assessment and treatment plan with the patient. The patient was provided an opportunity to ask questions and all were answered. The patient agreed with the plan and demonstrated an understanding of the instructions.  A copy of instructions were sent to the patient via MyChart unless otherwise noted below.    The patient was advised to call back or seek an in-person evaluation if the symptoms worsen or if the condition fails to improve as anticipated.  Time:  I spent 14 minutes with the patient via telehealth technology discussing the above problems/concerns.    Jannifer Rodney, FNP

## 2022-06-19 ENCOUNTER — Telehealth: Payer: Medicaid Other | Admitting: Emergency Medicine

## 2022-06-19 DIAGNOSIS — N898 Other specified noninflammatory disorders of vagina: Secondary | ICD-10-CM

## 2022-06-19 DIAGNOSIS — N949 Unspecified condition associated with female genital organs and menstrual cycle: Secondary | ICD-10-CM

## 2022-10-05 ENCOUNTER — Ambulatory Visit (INDEPENDENT_AMBULATORY_CARE_PROVIDER_SITE_OTHER): Payer: Medicaid Other | Admitting: Obstetrics & Gynecology

## 2022-10-05 ENCOUNTER — Encounter: Payer: Self-pay | Admitting: Obstetrics & Gynecology

## 2022-10-05 ENCOUNTER — Other Ambulatory Visit (HOSPITAL_COMMUNITY)
Admission: RE | Admit: 2022-10-05 | Discharge: 2022-10-05 | Disposition: A | Discharge status: Home / Self Care | Payer: Medicaid Other | Source: Ambulatory Visit | Attending: Obstetrics & Gynecology | Admitting: Obstetrics & Gynecology

## 2022-10-05 VITALS — BP 114/76 | HR 80 | Ht 62.0 in | Wt 160.0 lb

## 2022-10-05 DIAGNOSIS — Z01419 Encounter for gynecological examination (general) (routine) without abnormal findings: Secondary | ICD-10-CM

## 2022-10-05 DIAGNOSIS — Z113 Encounter for screening for infections with a predominantly sexual mode of transmission: Secondary | ICD-10-CM | POA: Insufficient documentation

## 2022-10-05 DIAGNOSIS — N921 Excessive and frequent menstruation with irregular cycle: Secondary | ICD-10-CM | POA: Diagnosis not present

## 2022-10-05 DIAGNOSIS — N898 Other specified noninflammatory disorders of vagina: Secondary | ICD-10-CM | POA: Diagnosis not present

## 2022-10-05 DIAGNOSIS — Z3041 Encounter for surveillance of contraceptive pills: Secondary | ICD-10-CM | POA: Diagnosis not present

## 2022-10-05 DIAGNOSIS — N760 Acute vaginitis: Secondary | ICD-10-CM

## 2022-10-05 DIAGNOSIS — B9689 Other specified bacterial agents as the cause of diseases classified elsewhere: Secondary | ICD-10-CM

## 2022-10-05 MED ORDER — DROSPIRENONE-ETHINYL ESTRADIOL 3-0.03 MG PO TABS: 1.0000 | ORAL_TABLET | Freq: Every day | ORAL | 11 refills | Status: DC

## 2022-10-05 NOTE — Progress Notes (Signed)
GYNECOLOGY ANNUAL PREVENTATIVE CARE ENCOUNTER NOTE  History:     Joyce Hodge is a 21 y.o. G32P1001 female here for a routine annual gynecologic exam.  Current complaints: irregular bleeding pattern on current OCPs. Worsened when she misses pills. Reports having some nausea and feeling tired too, wants pregnancy test today. Also desires annual STI screen.   Denies abnormal vaginal discharge, pelvic pain, problems with intercourse or other gynecologic concerns.    Gynecologic History Patient's last menstrual period was 08/26/2022 (approximate). Contraception: OCP (estrogen/progesterone) Last Pap: Never had one.   Obstetric History OB History  Gravida Para Term Preterm AB Living  1 1 1     1   SAB IAB Ectopic Multiple Live Births        0 1    # Outcome Date GA Lbr Len/2nd Weight Sex Delivery Anes PTL Lv  1 Term 05/12/20 [redacted]w[redacted]d 27:36 / 01:07 7 lb 0.5 oz (3.189 kg) F Vag-Spont EPI  LIV    Past Medical History:  Diagnosis Date   Medical history non-contributory    UTI (urinary tract infection)     Past Surgical History:  Procedure Laterality Date   MOUTH SURGERY     wisdom teeth removal   NO PAST SURGERIES      Current Outpatient Medications on File Prior to Visit  Medication Sig Dispense Refill   cetirizine (ZYRTEC ALLERGY) 10 MG tablet Take 1 tablet (10 mg total) by mouth daily. (Patient not taking: Reported on 10/05/2022) 30 tablet 2   fluticasone (FLONASE) 50 MCG/ACT nasal spray Place 2 sprays into both nostrils daily. (Patient not taking: Reported on 10/05/2022) 16 g 6   No current facility-administered medications on file prior to visit.    No Known Allergies  Social History:  reports that she quit smoking about 3 years ago. Her smoking use included cigars and cigarettes. She smoked an average of 2 packs per day. She has never used smokeless tobacco. She reports current alcohol use. She reports that she does not use drugs.  Family History  Problem Relation Age  of Onset   Healthy Mother    Healthy Father     The following portions of the patient's history were reviewed and updated as appropriate: allergies, current medications, past family history, past medical history, past social history, past surgical history and problem list.  Review of Systems Pertinent items noted in HPI and remainder of comprehensive ROS otherwise negative.  Physical Exam:  BP 114/76   Pulse 80   Ht 5\' 2"  (1.575 m)   Wt 160 lb (72.6 kg)   LMP 08/26/2022 (Approximate) Comment: irregular on pills  BMI 29.26 kg/m  CONSTITUTIONAL: Well-developed, well-nourished female in no acute distress.  HENT:  Normocephalic, atraumatic, External right and left ear normal.  EYES: Conjunctivae and EOM are normal. Pupils are equal, round, and reactive to light. No scleral icterus.  NECK: Normal range of motion, supple, no masses.  Normal thyroid.  SKIN: Skin is warm and dry. No rash noted. Not diaphoretic. No erythema. No pallor. MUSCULOSKELETAL: Normal range of motion. No tenderness.  No cyanosis, clubbing, or edema. NEUROLOGIC: Alert and oriented to person, place, and time. Normal reflexes, muscle tone coordination.  PSYCHIATRIC: Normal mood and affect. Normal behavior. Normal judgment and thought content. CARDIOVASCULAR: Normal heart rate noted, regular rhythm RESPIRATORY: Clear to auscultation bilaterally. Effort and breath sounds normal, no problems with respiration noted. BREASTS: Symmetric in size. No masses, tenderness, skin changes, nipple drainage, or lymphadenopathy bilaterally. Performed in  the presence of a chaperone. ABDOMEN: Soft, no distention noted.  No tenderness, rebound or guarding.  PELVIC: Normal appearing external genitalia and urethral meatus; normal appearing vaginal mucosa and cervix.  Yellow vaginal discharge noted, testing sample obtained.  Pap smear obtained.  Normal uterine size, no other palpable masses, no uterine or adnexal tenderness.  Performed in the  presence of a chaperone.   Assessment and Plan:    1. Breakthrough bleeding on birth control pills Likely due to missing pills but will do other lab evaluation including pregnancy test. Advised to take pills every day at same time, advised to set pill alarm on phone. - CBC - TSH Rfx on Abnormal to Free T4 - Beta hCG quant (ref lab)  2. Oral contraceptive pill surveillance Switched formulation to Yasmin, will monitor effect. Will also help more with acne issues, reported by patient.  Of note, discussed switching to other modalities to increase compliance, patient declined these. Will follow up in 2 months.  - drospirenone-ethinyl estradiol (YASMIN 28) 3-0.03 MG tablet; Take 1 tablet by mouth daily.  Dispense: 28 tablet; Refill: 11  3. Vaginal discharge - Cervicovaginal ancillary only done, will follow up results and manage accordingly.  4. Routine screening for STI (sexually transmitted infection) STI screen done, will follow up results and manage accordingly. Safe sex recommended. - RPR+HBsAg+HCVAb+... - Cervicovaginal ancillary only  5. Well woman exam with routine gynecological exam - Cytology - PAP( [place]) Will follow up results of pap smear and manage accordingly. Talked about need for cervical cancer surveillance, can have every three years if normal. Routine preventative health maintenance measures emphasized. Please refer to After Visit Summary for other counseling recommendations.      Verita Schneiders, MD, [place] for [employer], [place]

## 2022-10-05 NOTE — Progress Notes (Signed)
Pt would like to discuss irregular cycles on pill and other possible BC options.   Pt scored 10 on PHQ-9, declined counseling.

## 2022-10-06 LAB — CERVICOVAGINAL ANCILLARY ONLY
Bacterial Vaginitis (gardnerella): POSITIVE — AB
Candida Glabrata: NEGATIVE
Candida Vaginitis: NEGATIVE
Chlamydia: NEGATIVE
Comment: NEGATIVE
Comment: NEGATIVE
Comment: NEGATIVE
Comment: NEGATIVE
Comment: NEGATIVE
Comment: NORMAL
Neisseria Gonorrhea: NEGATIVE
Trichomonas: NEGATIVE

## 2022-10-06 LAB — CBC
Hematocrit: 38.5 % (ref 34.0–46.6)
Hemoglobin: 12.4 g/dL (ref 11.1–15.9)
MCH: 28.3 pg (ref 26.6–33.0)
MCHC: 32.2 g/dL (ref 31.5–35.7)
MCV: 88 fL (ref 79–97)
Platelets: 187 10*3/uL (ref 150–450)
RBC: 4.38 x10E6/uL (ref 3.77–5.28)
RDW: 12.8 % (ref 11.7–15.4)
WBC: 4 10*3/uL (ref 3.4–10.8)

## 2022-10-06 LAB — RPR+HBSAG+HCVAB+...
HIV Screen 4th Generation wRfx: NONREACTIVE
Hep C Virus Ab: NONREACTIVE
Hepatitis B Surface Ag: NEGATIVE
RPR Ser Ql: NONREACTIVE

## 2022-10-06 LAB — BETA HCG QUANT (REF LAB): hCG Quant: <1 m[IU]/mL

## 2022-10-06 LAB — TSH RFX ON ABNORMAL TO FREE T4: TSH: 1.69 u[IU]/mL (ref 0.450–4.500)

## 2022-10-06 MED ORDER — METRONIDAZOLE 500 MG PO TABS: 500.0000 mg | ORAL_TABLET | Freq: Two times a day (BID) | ORAL | 0 refills | Status: AC

## 2022-10-06 NOTE — Addendum Note (Signed)
Addended by: Verita Schneiders A on: 10/06/2022 12:41 PM   Modules accepted: Orders

## 2022-10-07 LAB — CYTOLOGY - PAP: Diagnosis: NEGATIVE

## 2022-10-27 ENCOUNTER — Telehealth: Payer: Medicaid Other | Admitting: Emergency Medicine

## 2022-10-27 DIAGNOSIS — J029 Acute pharyngitis, unspecified: Secondary | ICD-10-CM

## 2022-10-27 NOTE — Patient Instructions (Signed)
  Kadian A Cartier, thank you for joining Carvel Getting, NP for today's virtual visit.  While this provider is not your primary care provider (PCP), if your PCP is located in our provider database this encounter information will be shared with them immediately following your visit.   [facility] account gives you access to today's visit and all your visits, tests, and labs performed at [facility] " click here if you don't have a [facility] account or go to [email or web address]  Consent: (Patient) Joyce Hodge provided verbal consent for this virtual visit at the beginning of the encounter.  Current Medications:  Current Outpatient Medications:    cetirizine (ZYRTEC ALLERGY) 10 MG tablet, Take 1 tablet (10 mg total) by mouth daily. (Patient not taking: Reported on 10/05/2022), Disp: 30 tablet, Rfl: 2   drospirenone-ethinyl estradiol (YASMIN 28) 3-0.03 MG tablet, Take 1 tablet by mouth daily., Disp: 28 tablet, Rfl: 11   fluticasone (FLONASE) 50 MCG/ACT nasal spray, Place 2 sprays into both nostrils daily. (Patient not taking: Reported on 10/05/2022), Disp: 16 g, Rfl: 6   Medications ordered in this encounter:  No orders of the defined types were placed in this encounter.    *If you need refills on other medications prior to your next appointment, please contact your pharmacy*  Follow-Up: Call back or seek an in-person evaluation if the symptoms worsen or if the condition fails to improve as anticipated.  [facility] [phone]  Other Instructions Continue using tylenol for pain according to package directions.   Try gargling with salt water to help reduce any swelling or pain in your throat. You can also try using throat sprays and/or throat lozenges to help soothe your throat.    If you have been instructed to have an in-person evaluation today at a local Urgent Care facility, please use the link below. It will take you to a  list of all of our available [facility], including address, phone number and hours of operation. Please do not delay care.  [facility]  If you or a family member do not have a primary care provider, use the link below to schedule a visit and establish care. When you choose a [facility] primary care physician or advanced practice provider, you gain a long-term partner in health. Find a Primary Care Provider  Learn more about [facility]'s in-office and virtual care options: [facility]

## 2022-10-27 NOTE — Progress Notes (Signed)
Virtual Visit Consent   [name], you are scheduled for a virtual visit with a [facility] provider today. Just as with appointments in the office, your consent must be obtained to participate. Your consent will be active for this visit and any virtual visit you may have with one of our providers in the next 365 days. If you have a MyChart account, a copy of this consent can be sent to you electronically.  As this is a virtual visit, video technology does not allow for your provider to perform a traditional examination. This may limit your provider's ability to fully assess your condition. If your provider identifies any concerns that need to be evaluated in person or the need to arrange testing (such as labs, EKG, etc.), we will make arrangements to do so. Although advances in technology are sophisticated, we cannot ensure that it will always work on either your end or our end. If the connection with a video visit is poor, the visit may have to be switched to a telephone visit. With either a video or telephone visit, we are not always able to ensure that we have a secure connection.  By engaging in this virtual visit, you consent to the provision of healthcare and authorize for your insurance to be billed (if applicable) for the services provided during this visit. Depending on your insurance coverage, you may receive a charge related to this service.  I need to obtain your verbal consent now. Are you willing to proceed with your visit today? Pricilla A Scaffidi has provided verbal consent on 10/27/2022 for a virtual visit (video or telephone). Cathlyn Parsons, NP  Date: 10/27/2022 6:30 PM  Virtual Visit via Video Note   I, Cathlyn Parsons, connected with  [name]  ([id number], 2001-09-21) on 10/27/22 at  6:15 PM EDT by a video-enabled telemedicine application and verified that I am speaking with the correct person using two identifiers.  Location: Patient: Virtual Visit Location Patient:  Home Provider: Virtual Visit Location Provider: Home Office   I discussed the limitations of evaluation and management by telemedicine and the availability of in person appointments. The patient expressed understanding and agreed to proceed.    History of Present Illness: Joyce Hodge is a 21 y.o. who identifies as a female who was assigned female at birth, and is being seen today for sore throat, headache, and chills. Has had symptoms intermittently for last 3 days. Not so bothersome that she can't work. Denies fever but has not taken temperature. Denies nasal congestion or post nasal drainage. Denies cough. Looked in her own throat and it doesn't look red and she doesn't see white spots. Sore throat temporarily relieved by tylenol, aggravated by drinking soda.   HPI: HPI  Problems:  Patient Active Problem List   Diagnosis Date Noted   Postpartum examination following vaginal delivery 06/17/2020   Contraception management 06/17/2020   Alpha thalassemia silent carrier 10/30/2019    Allergies: No Known Allergies Medications:  Current Outpatient Medications:    cetirizine (ZYRTEC ALLERGY) 10 MG tablet, Take 1 tablet (10 mg total) by mouth daily. (Patient not taking: Reported on 10/05/2022), Disp: 30 tablet, Rfl: 2   drospirenone-ethinyl estradiol (YASMIN 28) 3-0.03 MG tablet, Take 1 tablet by mouth daily., Disp: 28 tablet, Rfl: 11   fluticasone (FLONASE) 50 MCG/ACT nasal spray, Place 2 sprays into both nostrils daily. (Patient not taking: Reported on 10/05/2022), Disp: 16 g, Rfl: 6  Observations/Objective: Patient is well-developed, well-nourished in  no acute distress.  Resting comfortably  at home.  Head is normocephalic, atraumatic.  No labored breathing.  Speech is clear and coherent with logical content.  Patient is alert and oriented at baseline.    Assessment and Plan: 1. Viral pharyngitis  She appears well. I think strep is unlikely. Discussed supportive care measures.    Follow Up Instructions: I discussed the assessment and treatment plan with the patient. The patient was provided an opportunity to ask questions and all were answered. The patient agreed with the plan and demonstrated an understanding of the instructions.  A copy of instructions were sent to the patient via MyChart unless otherwise noted below.    The patient was advised to call back or seek an in-person evaluation if the symptoms worsen or if the condition fails to improve as anticipated.  Time:  I spent 12 minutes with the patient via telehealth technology discussing the above problems/concerns.    Carvel Getting, NP

## 2022-12-07 ENCOUNTER — Encounter: Payer: Self-pay | Admitting: Obstetrics & Gynecology

## 2022-12-07 ENCOUNTER — Telehealth (INDEPENDENT_AMBULATORY_CARE_PROVIDER_SITE_OTHER): Payer: Medicaid Other | Admitting: Obstetrics & Gynecology

## 2022-12-07 DIAGNOSIS — Z3041 Encounter for surveillance of contraceptive pills: Secondary | ICD-10-CM | POA: Diagnosis not present

## 2022-12-07 NOTE — Progress Notes (Signed)
GYNECOLOGY VIRTUAL VISIT ENCOUNTER NOTE  Provider location: [facility] for [facility]   Patient location: Work [facility])  I connected with Joyce Hodge on 12/07/22 at  8:55 AM EST by MyChart Video Encounter and verified that I am speaking with the correct person using two identifiers.   I discussed the limitations, risks, security and privacy concerns of performing an evaluation and management service virtually and the availability of in person appointments. I also discussed with the patient that there may be a patient responsible charge related to this service. The patient expressed understanding and agreed to proceed.   History:  Joyce Hodge is a 21 y.o. G38P1001 female being evaluated followed up today after switching to Yasmin in October 2023 due to concerns about breakthrough bleeding on previous OCPs.  She reports no further breakthrough bleeding, but now she is not getting withdrawal bleeds and sh is okay with this. She denies any abnormal vaginal discharge, bleeding, pelvic pain or other concerns.       Past Medical History:  Diagnosis Date   UTI (urinary tract infection)    Past Surgical History:  Procedure Laterality Date   MOUTH SURGERY     wisdom teeth removal   The following portions of the patient's history were reviewed and updated as appropriate: allergies, current medications, past family history, past medical history, past social history, past surgical history and problem list.   Health Maintenance:  Normal pap on 10/05/2022.   Review of Systems:  Pertinent items noted in HPI and remainder of comprehensive ROS otherwise negative.  Physical Exam:   General:  Alert, oriented and cooperative. Patient appears to be in no acute distress.  Mental Status: Normal mood and affect. Normal behavior. Normal judgment and thought content.   Respiratory: Normal respiratory effort, no problems with respiration noted  Rest of physical  exam deferred due to type of encounter  Labs and Imaging Recent Results (from the past 2160 hour(s))  Cytology - PAP( [place])     Status: None   Collection Time: 10/05/22  8:49 AM  Result Value Ref Range   Adequacy      Satisfactory for evaluation; transformation zone component PRESENT.   Diagnosis      - Negative for intraepithelial lesion or malignancy (NILM)  Cervicovaginal ancillary only     Status: Abnormal   Collection Time: 10/05/22  8:49 AM  Result Value Ref Range   Neisseria Gonorrhea Negative    Chlamydia Negative    Trichomonas Negative    Bacterial Vaginitis (gardnerella) Positive (A)    Candida Vaginitis Negative    Candida Glabrata Negative    Comment      Normal Reference Range Bacterial Vaginosis - Negative   Comment Normal Reference Range Candida Species - Negative    Comment Normal Reference Range Candida Galbrata - Negative    Comment Normal Reference Range Trichomonas - Negative    Comment Normal Reference Ranger Chlamydia - Negative    Comment      Normal Reference Range Neisseria Gonorrhea - Negative  RPR+HBsAg+HCVAb+...     Status: None   Collection Time: 10/05/22  8:51 AM  Result Value Ref Range   Hepatitis B Surface Ag Negative Negative   Hep C Virus Ab Non Reactive Non Reactive    Comment: HCV antibody alone does not differentiate between previously resolved infection and active infection. Equivocal and Reactive HCV antibody results should be followed up with an HCV RNA test to  support the diagnosis of active HCV infection.    RPR Ser Ql Non Reactive Non Reactive   HIV Screen 4th Generation wRfx Non Reactive Non Reactive    Comment: HIV Negative HIV-1/HIV-2 antibodies and HIV-1 p24 antigen were NOT detected. There is no laboratory evidence of HIV infection.   CBC     Status: None   Collection Time: 10/05/22  8:51 AM  Result Value Ref Range   WBC 4.0 3.4 - 10.8 x10E3/uL   RBC 4.38 3.77 - 5.28 x10E6/uL   Hemoglobin 12.4 11.1 - 15.9 g/dL    Hematocrit 29.5 28.4 - 46.6 %   MCV 88 79 - 97 fL   MCH 28.3 26.6 - 33.0 pg   MCHC 32.2 31.5 - 35.7 g/dL   RDW 13.2 44.0 - 10.2 %   Platelets 187 150 - 450 x10E3/uL  Beta hCG quant (ref lab)     Status: None   Collection Time: 10/05/22  8:51 AM  Result Value Ref Range   hCG Quant <1 mIU/mL    Comment:                      Female (Non-pregnant)    0 -     5                             (Postmenopausal)  0 -     8                      Female (Pregnant)                      Weeks of Gestation                              3                6 -    71                              4               10 -   750                              5              217 -  7138                              6              158 - 31795                              7             3697 -[id number]                              8            [id number] -[phone]  9            63803 -151410                             10            46509 -186977                             12            27832 -210612                             14            13950 - 62530                             15            12039 - 70971                             16             9040 - 56451                             17             8175 - 55868                             18             8099 - 58176 Roche E CLIA methodology   TSH Rfx on Abnormal to Free T4     Status: None   Collection Time: 10/05/22  8:51 AM  Result Value Ref Range   TSH 1.690 0.450 - 4.500 uIU/mL       Assessment and Plan:     1. Oral contraceptive pill surveillance Reassured that amenorrhea can present in some patients on OCPs, as long as pregnancy is ruled out, this is not a concern.   Patient will have her BP checked while at work and send it via My Chart. She was told to call if she had any further concerns.     I discussed the assessment and treatment plan with the patient. The patient was provided an opportunity to ask questions and all  were answered. The patient agreed with the plan and demonstrated an understanding of the instructions.   The patient was advised to call back or seek an in-person evaluation/go to the ED if the symptoms worsen or if the condition fails to improve as anticipated.  I provided 15 minutes of face-to-face time during this encounter.   Jaynie Collins, MD Center for [employer], [facility]

## 2022-12-31 ENCOUNTER — Telehealth: Payer: Medicaid Other | Admitting: Physician Assistant

## 2022-12-31 DIAGNOSIS — M545 Low back pain, unspecified: Secondary | ICD-10-CM | POA: Diagnosis not present

## 2022-12-31 DIAGNOSIS — N3000 Acute cystitis without hematuria: Secondary | ICD-10-CM | POA: Diagnosis not present

## 2022-12-31 MED ORDER — CYCLOBENZAPRINE HCL 10 MG PO TABS: 5.0000 mg | ORAL_TABLET | Freq: Three times a day (TID) | ORAL | 0 refills | Status: DC | PRN

## 2022-12-31 MED ORDER — CEPHALEXIN 500 MG PO CAPS: 500.0000 mg | ORAL_CAPSULE | Freq: Two times a day (BID) | ORAL | 0 refills | Status: DC

## 2022-12-31 MED ORDER — NAPROXEN 500 MG PO TABS: 500.0000 mg | ORAL_TABLET | Freq: Two times a day (BID) | ORAL | 0 refills | Status: DC

## 2022-12-31 NOTE — Progress Notes (Signed)
Virtual Visit Consent   Joyce Hodge, you are scheduled for a virtual visit with a [facility] provider today. Just as with appointments in the office, your consent must be obtained to participate. Your consent will be active for this visit and any virtual visit you may have with one of our providers in the next 365 days. If you have a MyChart account, a copy of this consent can be sent to you electronically.  As this is a virtual visit, video technology does not allow for your provider to perform a traditional examination. This may limit your provider's ability to fully assess your condition. If your provider identifies any concerns that need to be evaluated in person or the need to arrange testing (such as labs, EKG, etc.), we will make arrangements to do so. Although advances in technology are sophisticated, we cannot ensure that it will always work on either your end or our end. If the connection with a video visit is poor, the visit may have to be switched to a telephone visit. With either a video or telephone visit, we are not always able to ensure that we have a secure connection.  By engaging in this virtual visit, you consent to the provision of healthcare and authorize for your insurance to be billed (if applicable) for the services provided during this visit. Depending on your insurance coverage, you may receive a charge related to this service.  I need to obtain your verbal consent now. Are you willing to proceed with your visit today? Joyce Hodge has provided verbal consent on 12/31/2022 for a virtual visit (video or telephone). Mar Daring, PA-C  Date: 12/31/2022 3:33 PM  Virtual Visit via Video Note   I, Mar Daring, connected with  Joyce Hodge  ([id number], 2001-03-05) on 12/31/22 at  3:15 PM EST by a video-enabled telemedicine application and verified that I am speaking with the correct person using two identifiers.  Location: Patient: Virtual Visit Location  Patient: Home Provider: Virtual Visit Location Provider: Home Office   I discussed the limitations of evaluation and management by telemedicine and the availability of in person appointments. The patient expressed understanding and agreed to proceed.    History of Present Illness: Joyce Hodge is a 22 y.o. who identifies as a female who was assigned female at birth, and is being seen today for back pain.  HPI: Back Pain This is a new problem. The current episode started in the past 7 days (wednesday night). The problem occurs constantly. The problem has been gradually worsening since onset. The pain is present in the lumbar spine. The quality of the pain is described as aching. Associated symptoms include dysuria. Pertinent negatives include no bladder incontinence, bowel incontinence, fever, headaches, leg pain, numbness, pelvic pain or perianal numbness. (Fatigue, malaise, nausea) She has tried nothing for the symptoms. The treatment provided no relief.     Problems:  Patient Active Problem List   Diagnosis Date Noted   Postpartum examination following vaginal delivery 06/17/2020   Contraception management 06/17/2020   Alpha thalassemia silent carrier 10/30/2019    Allergies: No Known Allergies Medications:  Current Outpatient Medications:    cephALEXin (KEFLEX) 500 MG capsule, Take 1 capsule (500 mg total) by mouth 2 (two) times daily., Disp: 14 capsule, Rfl: 0   cyclobenzaprine (FLEXERIL) 10 MG tablet, Take 0.5-1 tablets (5-10 mg total) by mouth 3 (three) times daily as needed., Disp: 30 tablet, Rfl: 0   naproxen (NAPROSYN) 500 MG  tablet, Take 1 tablet (500 mg total) by mouth 2 (two) times daily with a meal., Disp: 30 tablet, Rfl: 0   cetirizine (ZYRTEC ALLERGY) 10 MG tablet, Take 1 tablet (10 mg total) by mouth daily. (Patient not taking: Reported on 10/05/2022), Disp: 30 tablet, Rfl: 2   drospirenone-ethinyl estradiol (YASMIN 28) 3-0.03 MG tablet, Take 1 tablet by mouth daily.,  Disp: 28 tablet, Rfl: 11   fluticasone (FLONASE) 50 MCG/ACT nasal spray, Place 2 sprays into both nostrils daily. (Patient not taking: Reported on 10/05/2022), Disp: 16 g, Rfl: 6  Observations/Objective: Patient is well-developed, well-nourished in no acute distress.  Resting comfortably at home.  Head is normocephalic, atraumatic.  No labored breathing.  Speech is clear and coherent with logical content.  Patient is alert and oriented at baseline.    Assessment and Plan: 1. Acute midline low back pain without sciatica - cyclobenzaprine (FLEXERIL) 10 MG tablet; Take 0.5-1 tablets (5-10 mg total) by mouth 3 (three) times daily as needed.  Dispense: 30 tablet; Refill: 0 - naproxen (NAPROSYN) 500 MG tablet; Take 1 tablet (500 mg total) by mouth 2 (two) times daily with a meal.  Dispense: 30 tablet; Refill: 0  2. Acute cystitis without hematuria - cephALEXin (KEFLEX) 500 MG capsule; Take 1 capsule (500 mg total) by mouth 2 (two) times daily.  Dispense: 14 capsule; Refill: 0  - Possibly two separate issues, but could be all from one; suspect UTI with separate associated back pain. Could be both associated with UTI - Will treat with Keflex for UTI - Flexeril and naproxen for back pain - Push fluids - Seek in person evaluation if either do not improve with treatment or if symptoms worsen  Follow Up Instructions: I discussed the assessment and treatment plan with the patient. The patient was provided an opportunity to ask questions and all were answered. The patient agreed with the plan and demonstrated an understanding of the instructions.  A copy of instructions were sent to the patient via MyChart unless otherwise noted below.    The patient was advised to call back or seek an in-person evaluation if the symptoms worsen or if the condition fails to improve as anticipated.  Time:  I spent 12 minutes with the patient via telehealth technology discussing the above problems/concerns.     Mar Daring, PA-C

## 2022-12-31 NOTE — Patient Instructions (Signed)
Joyce Hodge, thank you for joining Joyce Loveless, PA-C for today's virtual visit.  While this provider is not your primary care provider (PCP), if your PCP is located in our provider database this encounter information will be shared with them immediately following your visit.   A [facility] MyChart account gives you access to today's visit and all your visits, tests, and labs performed at [facility] " click here if you don't have a [facility] MyChart account or go to [email or web address]  Consent: (Patient) Joyce Hodge provided verbal consent for this virtual visit at the beginning of the encounter.  Current Medications:  Current Outpatient Medications:    cephALEXin (KEFLEX) 500 MG capsule, Take 1 capsule (500 mg total) by mouth 2 (two) times daily., Disp: 14 capsule, Rfl: 0   cyclobenzaprine (FLEXERIL) 10 MG tablet, Take 0.5-1 tablets (5-10 mg total) by mouth 3 (three) times daily as needed., Disp: 30 tablet, Rfl: 0   naproxen (NAPROSYN) 500 MG tablet, Take 1 tablet (500 mg total) by mouth 2 (two) times daily with a meal., Disp: 30 tablet, Rfl: 0   cetirizine (ZYRTEC ALLERGY) 10 MG tablet, Take 1 tablet (10 mg total) by mouth daily. (Patient not taking: Reported on 10/05/2022), Disp: 30 tablet, Rfl: 2   drospirenone-ethinyl estradiol (YASMIN 28) 3-0.03 MG tablet, Take 1 tablet by mouth daily., Disp: 28 tablet, Rfl: 11   fluticasone (FLONASE) 50 MCG/ACT nasal spray, Place 2 sprays into both nostrils daily. (Patient not taking: Reported on 10/05/2022), Disp: 16 g, Rfl: 6   Medications ordered in this encounter:  Meds ordered this encounter  Medications   cephALEXin (KEFLEX) 500 MG capsule    Sig: Take 1 capsule (500 mg total) by mouth 2 (two) times daily.    Dispense:  14 capsule    Refill:  0    Order Specific Question:   Supervising Provider    Answer:   Merrilee Jansky [[id number]]   cyclobenzaprine (FLEXERIL) 10 MG tablet    Sig: Take 0.5-1 tablets  (5-10 mg total) by mouth 3 (three) times daily as needed.    Dispense:  30 tablet    Refill:  0    Order Specific Question:   Supervising Provider    Answer:   Merrilee Jansky [[id number]]   naproxen (NAPROSYN) 500 MG tablet    Sig: Take 1 tablet (500 mg total) by mouth 2 (two) times daily with a meal.    Dispense:  30 tablet    Refill:  0    Order Specific Question:   Supervising Provider    Answer:   Merrilee Jansky [id number]     *If you need refills on other medications prior to your next appointment, please contact your pharmacy*  Follow-Up: Call back or seek an in-person evaluation if the symptoms worsen or if the condition fails to improve as anticipated.  [facility] [phone]  Other Instructions  Urinary Tract Infection, Adult  A urinary tract infection (UTI) is an infection of any part of the urinary tract. The urinary tract includes the kidneys, ureters, bladder, and urethra. These organs make, store, and get rid of urine in the body. An upper UTI affects the ureters and kidneys. A lower UTI affects the bladder and urethra. What are the causes? Most urinary tract infections are caused by bacteria in your genital area around your urethra, where urine leaves your body. These bacteria grow and cause inflammation of your urinary tract. What increases  the risk? You are more likely to develop this condition if: You have a urinary catheter that stays in place. You are not able to control when you urinate or have a bowel movement (incontinence). You are female and you: Use a spermicide or diaphragm for birth control. Have low estrogen levels. Are pregnant. You have certain genes that increase your risk. You are sexually active. You take antibiotic medicines. You have a condition that causes your flow of urine to slow down, such as: An enlarged prostate, if you are female. Blockage in your urethra. A kidney stone. A nerve condition that affects your  bladder control (neurogenic bladder). Not getting enough to drink, or not urinating often. You have certain medical conditions, such as: Diabetes. A weak disease-fighting system (immunesystem). Sickle cell disease. Gout. Spinal cord injury. What are the signs or symptoms? Symptoms of this condition include: Needing to urinate right away (urgency). Frequent urination. This may include small amounts of urine each time you urinate. Pain or burning with urination. Blood in the urine. Urine that smells bad or unusual. Trouble urinating. Cloudy urine. Vaginal discharge, if you are female. Pain in the abdomen or the lower back. You may also have: Vomiting or a decreased appetite. Confusion. Irritability or tiredness. A fever or chills. Diarrhea. The first symptom in older adults may be confusion. In some cases, they may not have any symptoms until the infection has worsened. How is this diagnosed? This condition is diagnosed based on your medical history and a physical exam. You may also have other tests, including: Urine tests. Blood tests. Tests for STIs (sexually transmitted infections). If you have had more than one UTI, a cystoscopy or imaging studies may be done to determine the cause of the infections. How is this treated? Treatment for this condition includes: Antibiotic medicine. Over-the-counter medicines to treat discomfort. Drinking enough water to stay hydrated. If you have frequent infections or have other conditions such as a kidney stone, you may need to see a health care provider who specializes in the urinary tract (urologist). In rare cases, urinary tract infections can cause sepsis. Sepsis is a life-threatening condition that occurs when the body responds to an infection. Sepsis is treated in the hospital with IV antibiotics, fluids, and other medicines. Follow these instructions at home:  Medicines Take over-the-counter and prescription medicines only as told  by your health care provider. If you were prescribed an antibiotic medicine, take it as told by your health care provider. Do not stop using the antibiotic even if you start to feel better. General instructions Make sure you: Empty your bladder often and completely. Do not hold urine for long periods of time. Empty your bladder after sex. Wipe from front to back after urinating or having a bowel movement if you are female. Use each tissue only one time when you wipe. Drink enough fluid to keep your urine pale yellow. Keep all follow-up visits. This is important. Contact a health care provider if: Your symptoms do not get better after 1-2 days. Your symptoms go away and then return. Get help right away if: You have severe pain in your back or your lower abdomen. You have a fever or chills. You have nausea or vomiting. Summary A urinary tract infection (UTI) is an infection of any part of the urinary tract, which includes the kidneys, ureters, bladder, and urethra. Most urinary tract infections are caused by bacteria in your genital area. Treatment for this condition often includes antibiotic medicines. If you were  prescribed an antibiotic medicine, take it as told by your health care provider. Do not stop using the antibiotic even if you start to feel better. Keep all follow-up visits. This is important. This information is not intended to replace advice given to you by your health care provider. Make sure you discuss any questions you have with your health care provider. Document Revised: 07/25/2020 Document Reviewed: 07/25/2020 Elsevier Patient Education  [place].   Acute Back Pain, Adult Acute back pain is sudden and usually short-lived. It is often caused by an injury to the muscles and tissues in the back. The injury may result from: A muscle, tendon, or ligament getting overstretched or torn. Ligaments are tissues that connect bones to each other. Lifting something  improperly can cause a back strain. Wear and tear (degeneration) of the spinal disks. Spinal disks are circular tissue that provide cushioning between the bones of the spine (vertebrae). Twisting motions, such as while playing sports or doing yard work. A hit to the back. Arthritis. You may have a physical exam, lab tests, and imaging tests to find the cause of your pain. Acute back pain usually goes away with rest and home care. Follow these instructions at home: Managing pain, stiffness, and swelling Take over-the-counter and prescription medicines only as told by your health care provider. Treatment may include medicines for pain and inflammation that are taken by mouth or applied to the skin, or muscle relaxants. Your health care provider may recommend applying ice during the first 24-48 hours after your pain starts. To do this: Put ice in a plastic bag. Place a towel between your skin and the bag. Leave the ice on for 20 minutes, 2-3 times a day. Remove the ice if your skin turns bright red. This is very important. If you cannot feel pain, heat, or cold, you have a greater risk of damage to the area. If directed, apply heat to the affected area as often as told by your health care provider. Use the heat source that your health care provider recommends, such as a moist heat pack or a heating pad. Place a towel between your skin and the heat source. Leave the heat on for 20-30 minutes. Remove the heat if your skin turns bright red. This is especially important if you are unable to feel pain, heat, or cold. You have a greater risk of getting burned. Activity  Do not stay in bed. Staying in bed for more than 1-2 days can delay your recovery. Sit up and stand up straight. Avoid leaning forward when you sit or hunching over when you stand. If you work at a desk, sit close to it so you do not need to lean over. Keep your chin tucked in. Keep your neck drawn back, and keep your elbows bent at a  90-degree angle (right angle). Sit high and close to the steering wheel when you drive. Add lower back (lumbar) support to your car seat, if needed. Take short walks on even surfaces as soon as you are able. Try to increase the length of time you walk each day. Do not sit, drive, or stand in one place for more than 30 minutes at a time. Sitting or standing for long periods of time can put stress on your back. Do not drive or use heavy machinery while taking prescription pain medicine. Use proper lifting techniques. When you bend and lift, use positions that put less stress on your back: [place] your knees. Keep the load  close to your body. Avoid twisting. Exercise regularly as told by your health care provider. Exercising helps your back heal faster and helps prevent back injuries by keeping muscles strong and flexible. Work with a physical therapist to make a safe exercise program, as recommended by your health care provider. Do any exercises as told by your physical therapist. Lifestyle Maintain a healthy weight. Extra weight puts stress on your back and makes it difficult to have good posture. Avoid activities or situations that make you feel anxious or stressed. Stress and anxiety increase muscle tension and can make back pain worse. Learn ways to manage anxiety and stress, such as through exercise. General instructions Sleep on a firm mattress in a comfortable position. Try lying on your side with your knees slightly bent. If you lie on your back, put a pillow under your knees. Keep your head and neck in a straight line with your spine (neutral position) when using electronic equipment like smartphones or pads. To do this: Raise your smartphone or pad to look at it instead of bending your head or neck to look down. Put the smartphone or pad at the level of your face while looking at the screen. Follow your treatment plan as told by your health care provider. This may include: Cognitive or  behavioral therapy. Acupuncture or massage therapy. Meditation or yoga. Contact a health care provider if: You have pain that is not relieved with rest or medicine. You have increasing pain going down into your legs or buttocks. Your pain does not improve after 2 weeks. You have pain at night. You lose weight without trying. You have a fever or chills. You develop nausea or vomiting. You develop abdominal pain. Get help right away if: You develop new bowel or bladder control problems. You have unusual weakness or numbness in your arms or legs. You feel faint. These symptoms may represent a serious problem that is an emergency. Do not wait to see if the symptoms will go away. Get medical help right away. Call your local emergency services (911 in the [place] Do not drive yourself to the hospital. Summary Acute back pain is sudden and usually short-lived. Use proper lifting techniques. When you bend and lift, use positions that put less stress on your back. Take over-the-counter and prescription medicines only as told by your health care provider, and apply heat or ice as told. This information is not intended to replace advice given to you by your health care provider. Make sure you discuss any questions you have with your health care provider. Document Revised: 03/06/2021 Document Reviewed: 03/06/2021 Elsevier Patient Education  2023 Elsevier Inc.    If you have been instructed to have an in-person evaluation today at a local Urgent Care facility, please use the link below. It will take you to a list of all of our available [facility], including address, phone number and hours of operation. Please do not delay care.  [facility]  If you or a family member do not have a primary care provider, use the link below to schedule a visit and establish care. When you choose a [facility] primary care physician or advanced practice provider, you gain a long-term partner  in health. Find a Primary Care Provider  Learn more about [facility]'s in-office and virtual care options: [facility] - Get Care Now

## 2023-06-03 ENCOUNTER — Ambulatory Visit (HOSPITAL_COMMUNITY)
Admission: EM | Admit: 2023-06-03 | Discharge: 2023-06-03 | Disposition: A | Discharge status: Home / Self Care | Payer: Medicaid Other | Attending: Urgent Care | Admitting: Urgent Care

## 2023-06-03 ENCOUNTER — Telehealth (HOSPITAL_COMMUNITY): Payer: Self-pay | Admitting: *Deleted

## 2023-06-03 ENCOUNTER — Encounter (HOSPITAL_COMMUNITY): Payer: Self-pay

## 2023-06-03 DIAGNOSIS — J039 Acute tonsillitis, unspecified: Secondary | ICD-10-CM | POA: Diagnosis not present

## 2023-06-03 DIAGNOSIS — M545 Low back pain, unspecified: Secondary | ICD-10-CM

## 2023-06-03 DIAGNOSIS — R21 Rash and other nonspecific skin eruption: Secondary | ICD-10-CM | POA: Diagnosis not present

## 2023-06-03 LAB — POCT RAPID STREP A (OFFICE): Rapid Strep A Screen: NEGATIVE

## 2023-06-03 MED ORDER — PREDNISONE 10 MG (21) PO TBPK: ORAL_TABLET | Freq: Every day | ORAL | 0 refills | Status: DC

## 2023-06-03 MED ORDER — TRIAMCINOLONE ACETONIDE 0.1 % EX CREA: 1.0000 | TOPICAL_CREAM | Freq: Two times a day (BID) | CUTANEOUS | 0 refills | Status: DC

## 2023-06-03 MED ORDER — AZITHROMYCIN 250 MG PO TABS: 250.0000 mg | ORAL_TABLET | Freq: Every day | ORAL | 0 refills | Status: DC

## 2023-06-03 NOTE — Discharge Instructions (Addendum)
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  Please start taking the prednisone as prescribed.  Take this once daily in the morning. Please also take the azithromycin per package directions.  Should your rash persist once the oral prednisone is completed, then please start using topical steroid called in today as well.  Please call your PCP to schedule a follow-up.

## 2023-06-03 NOTE — ED Provider Notes (Signed)
[facility]    CSN: [id number] Arrival date & time: 06/03/23  1513      History   Chief Complaint Chief Complaint  Patient presents with   Rash    HPI Joyce Hodge is a 22 y.o. female.   Pleasant 22 year old female presents to due to concerns of a rash.  She states she has had this rash for the past week.  She is uncertain what the cause is.  She states it is extremely itchy, has used over-the-counter hydrocortisone cream and Benadryl without any symptomatic relief.  She does report having had a sore throat a week ago, but this resolved spontaneously without treatment. No additional s/sx.   Rash   Past Medical History:  Diagnosis Date   UTI (urinary tract infection)     Patient Active Problem List   Diagnosis Date Noted   Postpartum examination following vaginal delivery 06/17/2020   Contraception management 06/17/2020   Alpha thalassemia silent carrier 10/30/2019    Past Surgical History:  Procedure Laterality Date   MOUTH SURGERY     wisdom teeth removal    OB History     Gravida  1   Para  1   Term  1   Preterm      AB      Living  1      SAB      IAB      Ectopic      Multiple  0   Live Births  1            Home Medications    Prior to Admission medications   Medication Sig Start Date End Date Taking? Authorizing Provider  azithromycin (ZITHROMAX) 250 MG tablet Take 1 tablet (250 mg total) by mouth daily. Take first 2 tablets together, then 1 every day until finished. 06/03/23  Yes Jhene Westmoreland L, PA  predniSONE (STERAPRED UNI-PAK 21 TAB) 10 MG (21) TBPK tablet Take by mouth daily. Take 6 tabs by mouth daily  for 1 days, then 5 tabs for 1 days, then 4 tabs for 1 days, then 3 tabs for 1 days, 2 tabs for 1 days, then 1 tab by mouth daily for 1 days 06/03/23  Yes Lulu Hirschmann L, PA  triamcinolone cream (KENALOG) 0.1 % Apply 1 Application topically 2 (two) times daily. Do not apply on face or groin; do not exceed 14 days  consecutive use 06/03/23  Yes Leisel Pinette L, PA  cetirizine (ZYRTEC ALLERGY) 10 MG tablet Take 1 tablet (10 mg total) by mouth daily. Patient not taking: Reported on 10/05/2022 05/26/22   Junie Spencer, FNP  fluticasone [facility]) 50 MCG/ACT nasal spray Place 2 sprays into both nostrils daily. Patient not taking: Reported on 10/05/2022 05/26/22   Junie Spencer, FNP  naproxen (NAPROSYN) 500 MG tablet Take 1 tablet (500 mg total) by mouth 2 (two) times daily with a meal. 12/31/22   Burnette, Alessandra Bevels, PA-C    Family History Family History  Problem Relation Age of Onset   Healthy Mother    Healthy Father     Social History Social History   Tobacco Use   Smoking status: Former    Packs/day: 2    Types: Cigars, Cigarettes    Quit date: 08/13/2019    Years since quitting: 3.8   Smokeless tobacco: Never  Vaping Use   Vaping Use: Every day  Substance Use Topics   Alcohol use: Yes   Drug use: Never  Allergies   Patient has no known allergies.   Review of Systems Review of Systems  Skin:  Positive for rash.  As per HPI   Physical Exam Triage Vital Signs ED Triage Vitals  Enc Vitals Group     BP 06/03/23 1556 125/79     Pulse Rate 06/03/23 1556 82     Resp 06/03/23 1556 16     Temp 06/03/23 1556 98.1 F (36.7 C)     Temp Source 06/03/23 1556 Oral     SpO2 06/03/23 1557 98 %     Weight --      Height --      Head Circumference --      Peak Flow --      Pain Score --      Pain Loc --      Pain Edu? --      Excl. in GC? --    No data found.  Updated Vital Signs BP 125/79 (BP Location: Left Arm)   Pulse 82   Temp 98.1 F (36.7 C) (Oral)   Resp 16   LMP 05/31/2023   SpO2 98%   Visual Acuity Right Eye Distance:   Left Eye Distance:   Bilateral Distance:    Right Eye Near:   Left Eye Near:    Bilateral Near:     Physical Exam Vitals and nursing note reviewed.  Constitutional:      General: She is not in acute distress.    Appearance: Normal  appearance. She is normal weight. She is not ill-appearing, toxic-appearing or diaphoretic.  HENT:     Head: Normocephalic and atraumatic.     Jaw: There is normal jaw occlusion. No swelling or malocclusion.     Salivary Glands: Right salivary gland is not diffusely enlarged or tender. Left salivary gland is not diffusely enlarged or tender.     Right Ear: Tympanic membrane, ear canal and external ear normal. There is no impacted cerumen.     Left Ear: Tympanic membrane, ear canal and external ear normal. There is no impacted cerumen.     Nose: Nose normal. No congestion or rhinorrhea.     Mouth/Throat:     Lips: Pink.     Mouth: Mucous membranes are moist. No oral lesions.     Pharynx: Oropharynx is clear. No oropharyngeal exudate or posterior oropharyngeal erythema.     Tonsils: Tonsillar exudate present. 3+ on the right. 3+ on the left.  Cardiovascular:     Rate and Rhythm: Normal rate and regular rhythm.  Pulmonary:     Effort: Pulmonary effort is normal. No respiratory distress.     Breath sounds: Normal breath sounds. No stridor.  Musculoskeletal:     Cervical back: Normal range of motion and neck supple. No rigidity or tenderness.  Lymphadenopathy:     Cervical: Cervical adenopathy present.  Skin:    General: Skin is warm.     Coloration: Skin is not jaundiced.     Findings: Rash (raised, inflamed papules on arms and papules/ plaques with some scaling noted to back and abdomen) present. No bruising.  Neurological:     General: No focal deficit present.     Mental Status: She is alert and oriented to person, place, and time.     Sensory: No sensory deficit.      UC Treatments / Results  Labs (all labs ordered are listed, but only abnormal results are displayed) Labs Reviewed  POCT RAPID STREP A (OFFICE)  EKG   Radiology No results found.  Procedures Procedures (including critical care time)  Medications Ordered in UC Medications - No data to  display  Initial Impression / Assessment and Plan / UC Course  I have reviewed the triage vital signs and the nursing notes.  Pertinent labs & imaging results that were available during my care of the patient were reviewed by me and considered in my medical decision making (see chart for details).     Rash -differential diagnosis for rash includes contact dermatitis, scabies, pityriasis rosea, guttate psoriasis.  Given her recent throat symptoms and appearance of her tonsils, this favors gout Arlana Pouch psoriasis is the cause.  Therefore I will discharge her home with topical steroids and azithromycin.  Will recommend patient follow-up with PCP should her symptoms persist for possible Derm referral. Acute tonsillitis -rapid strep was negative, however cervical adenopathy noted and significant tonsillar exudates therefore we will start azithromycin.   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  Please start taking the prednisone as prescribed.  Take this once daily in the morning. Please also take the azithromycin per package directions.  Should your rash persist once the oral prednisone is completed, then please start using topical steroid called in today as well.  Please call your PCP to schedule a follow-up.     ED Prescriptions     Medication Sig Dispense Auth. Provider   predniSONE (STERAPRED UNI-PAK 21 TAB) 10 MG (21) TBPK tablet Take by mouth daily. Take 6 tabs by mouth daily  for 1 days, then 5 tabs for 1 days, then 4 tabs for 1 days, then 3 tabs for 1 days, 2 tabs for 1 days, then 1 tab by mouth daily for 1 days 21 tablet Terryon Pineiro L, PA   azithromycin (ZITHROMAX) 250 MG tablet Take 1 tablet (250 mg total) by mouth daily. Take first  2 tablets together, then 1 every day until finished. 6 tablet Dayvian Blixt L, PA   triamcinolone cream (KENALOG) 0.1 % Apply 1 Application topically 2 (two) times daily. Do not apply on face or groin; do not exceed 14 days consecutive use 30 g Kawehi Hostetter L, PA      PDMP not reviewed this encounter.   Maretta Bees, [place] 06/03/23 1708

## 2023-06-03 NOTE — ED Triage Notes (Signed)
Pt is here for rash all over her body x 1 week.

## 2023-06-16 ENCOUNTER — Encounter: Payer: Self-pay | Admitting: *Deleted

## 2023-06-16 ENCOUNTER — Other Ambulatory Visit: Payer: Self-pay

## 2023-06-16 DIAGNOSIS — Z041 Encounter for examination and observation following transport accident: Secondary | ICD-10-CM | POA: Diagnosis not present

## 2023-06-16 DIAGNOSIS — Y92410 Unspecified street and highway as the place of occurrence of the external cause: Secondary | ICD-10-CM | POA: Diagnosis not present

## 2023-06-16 DIAGNOSIS — S39012A Strain of muscle, fascia and tendon of lower back, initial encounter: Secondary | ICD-10-CM | POA: Diagnosis not present

## 2023-06-16 DIAGNOSIS — M549 Dorsalgia, unspecified: Secondary | ICD-10-CM | POA: Diagnosis not present

## 2023-06-16 DIAGNOSIS — R519 Headache, unspecified: Secondary | ICD-10-CM | POA: Insufficient documentation

## 2023-06-16 DIAGNOSIS — M545 Low back pain, unspecified: Secondary | ICD-10-CM | POA: Diagnosis present
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
## 2023-06-17 ENCOUNTER — Emergency Department: Payer: No Typology Code available for payment source

## 2023-06-17 ENCOUNTER — Emergency Department
Admission: EM | Admit: 2023-06-17 | Discharge: 2023-06-17 | Disposition: A | Discharge status: Home / Self Care | Payer: No Typology Code available for payment source | Attending: Emergency Medicine | Admitting: Emergency Medicine

## 2023-06-17 DIAGNOSIS — M549 Dorsalgia, unspecified: Secondary | ICD-10-CM | POA: Diagnosis not present

## 2023-06-17 DIAGNOSIS — Z041 Encounter for examination and observation following transport accident: Secondary | ICD-10-CM | POA: Diagnosis not present

## 2023-06-17 DIAGNOSIS — S39012A Strain of muscle, fascia and tendon of lower back, initial encounter: Secondary | ICD-10-CM

## 2023-06-17 DIAGNOSIS — R519 Headache, unspecified: Secondary | ICD-10-CM | POA: Diagnosis not present

## 2023-06-17 LAB — POC URINE PREG, ED: Preg Test, Ur: NEGATIVE

## 2023-06-17 MED ORDER — KETOROLAC TROMETHAMINE 30 MG/ML IJ SOLN
30.0000 mg | Freq: Once | INTRAMUSCULAR | Status: AC
  Administered 2023-06-17: 30 mg via INTRAMUSCULAR
  Filled 2023-06-17: qty 1

## 2023-06-17 NOTE — ED Notes (Signed)
ED Provider at bedside. 

## 2023-06-17 NOTE — ED Notes (Signed)
PT brought to ed rm 13H at this time, this RN now assuming care.  

## 2023-06-17 NOTE — Discharge Instructions (Signed)
Please take Tylenol and ibuprofen/Advil for your pain.  It is safe to take them together, or to alternate them every few hours.  Take up to 1000mg of Tylenol at a time, up to 4 times per day.  Do not take more than 4000 mg of Tylenol in 24 hours.  For ibuprofen, take 400-600 mg, 3 - 4 times per day.  
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
## 2023-06-17 NOTE — ED Notes (Signed)
Patient transported to CT 

## 2023-08-02 IMAGING — CT CT HEAD W/O CM
3 of 4 series · 15 of 47 positions shown, 18 images · non-contrast
Comparison: None.

CLINICAL DATA: Headache.

EXAM:
CT HEAD WITHOUT CONTRAST
TECHNIQUE: Contiguous axial images were obtained from the base of the skull
through the vertex without intravenous contrast.

[Series 2: head wo · axial · 0.43mm/px · z∈[-193,-73]mm · 9 of 31 slices shown, 12 images]
[im 4/31  brain]
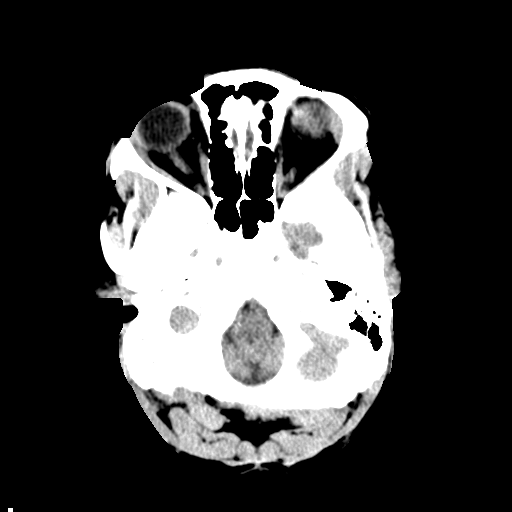
[im 4/31  bone]
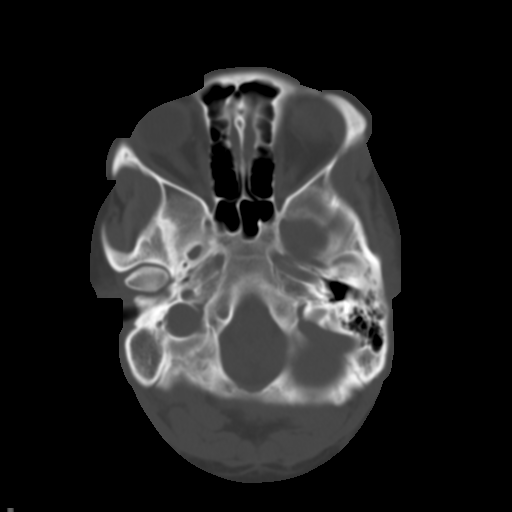
[im 7/31  brain]
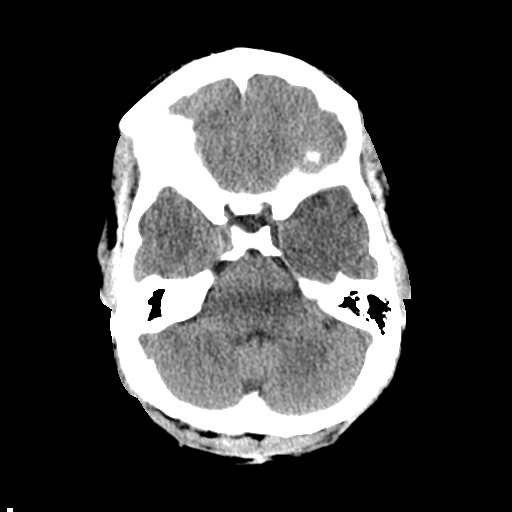
[im 10/31  brain]
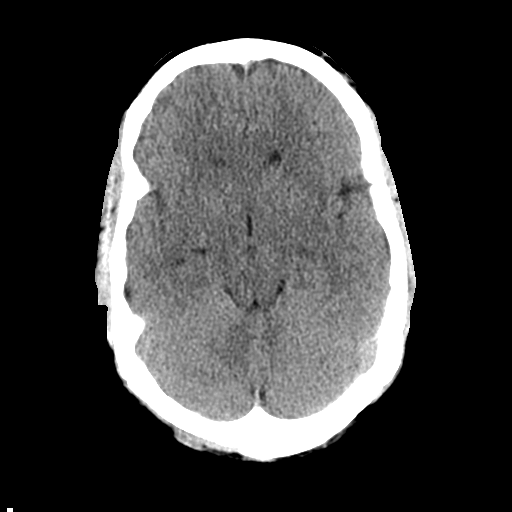
[im 13/31  brain]
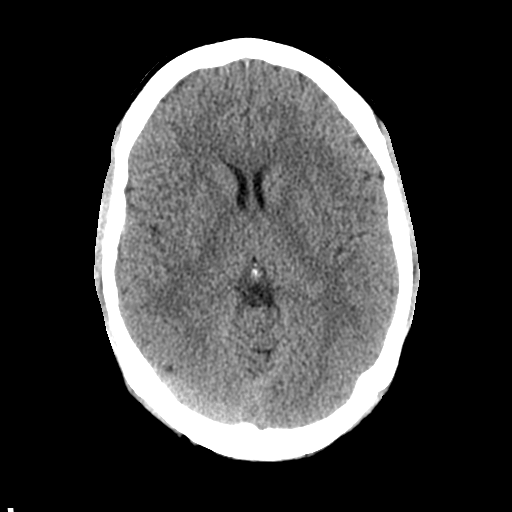
[im 16/31  brain]
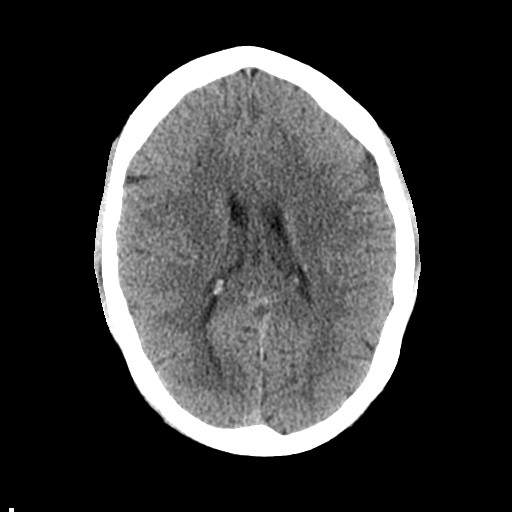
[im 16/31  bone]
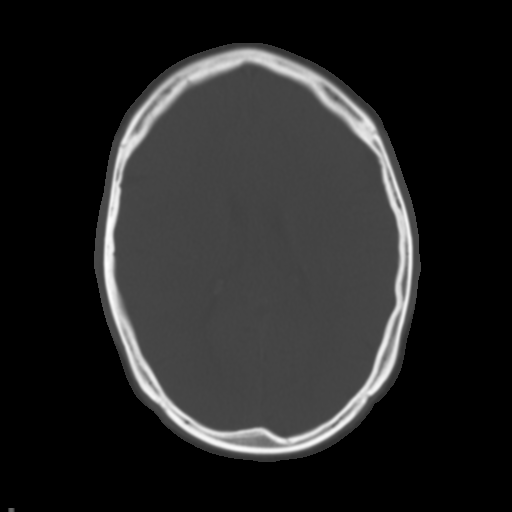
[im 19/31  brain]
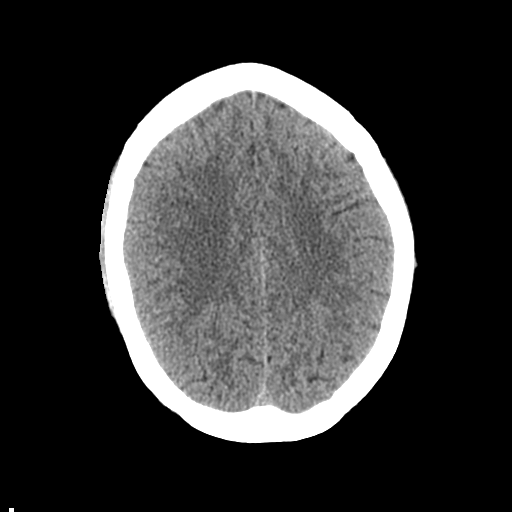
[im 22/31  brain]
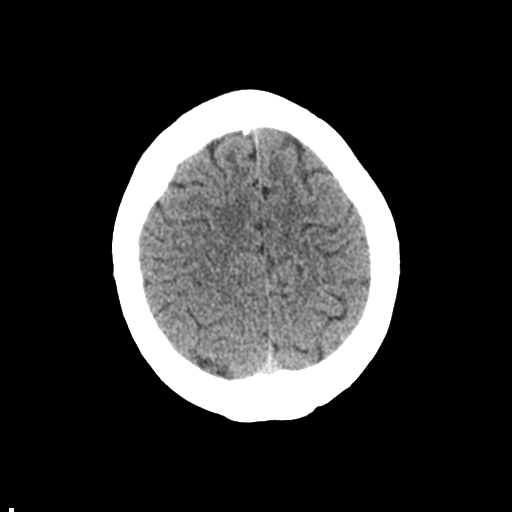
[im 25/31  brain]
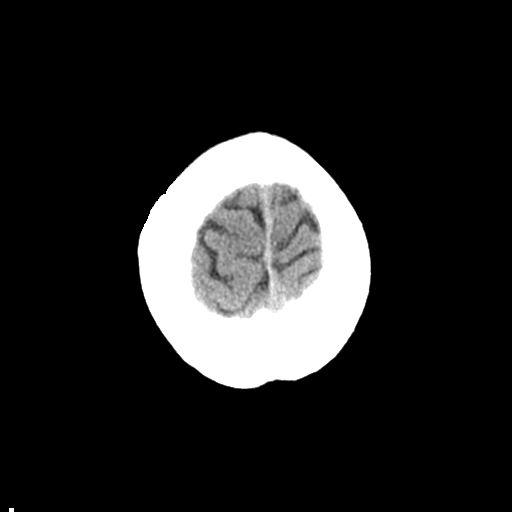
[im 28/31  brain]
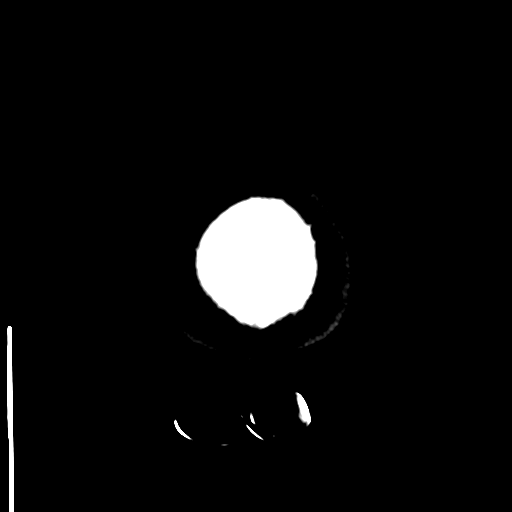
[im 28/31  bone]
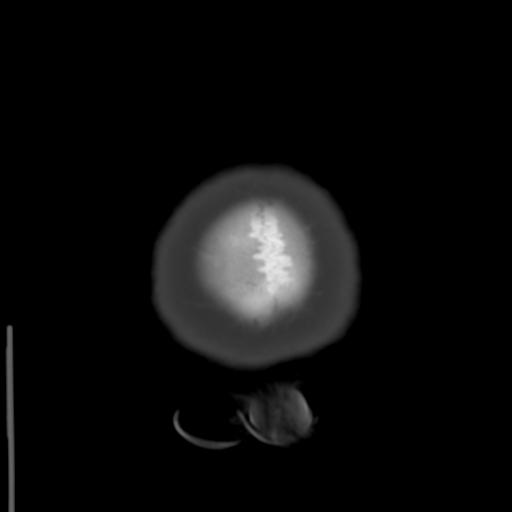

[Series 5: coronal soft tissue · coronal · 0.32mm/px · 3 of 72 slices shown]
[im 24/72  brain]
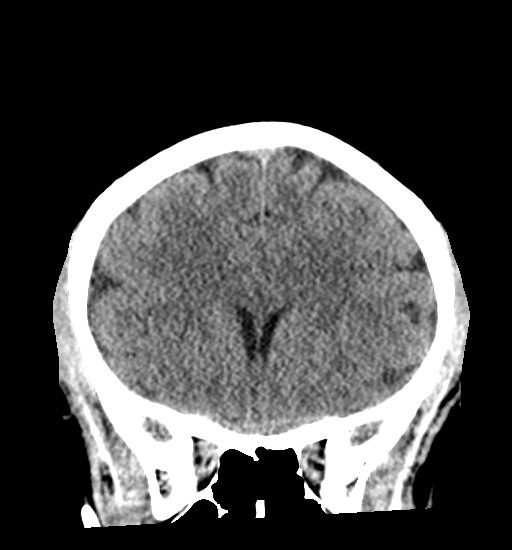
[im 32/72  brain]
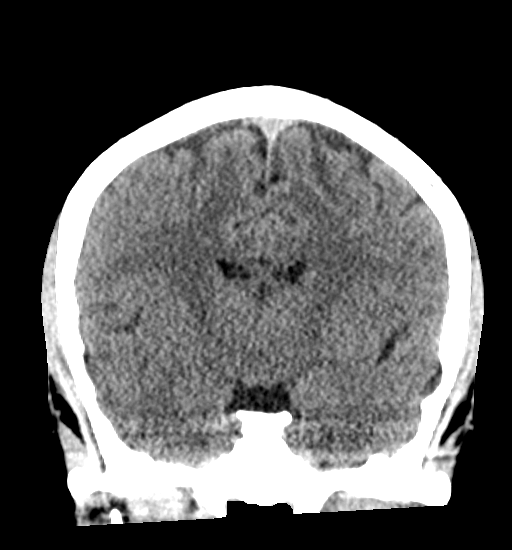
[im 40/72  brain]
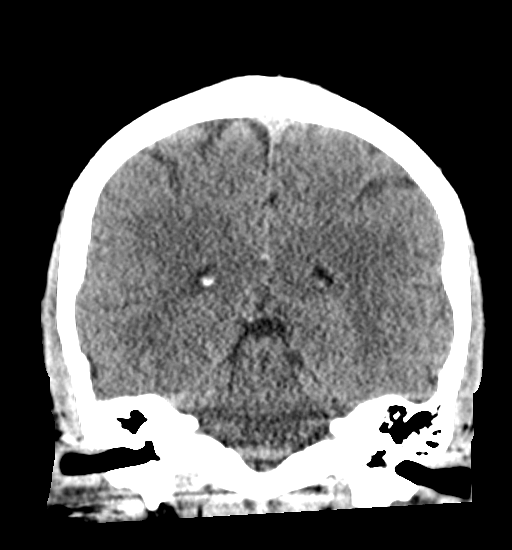

[Series 6: sagittal soft tissue · sagittal · 0.34mm/px · 3 of 55 slices shown]
[im 19/55  brain]
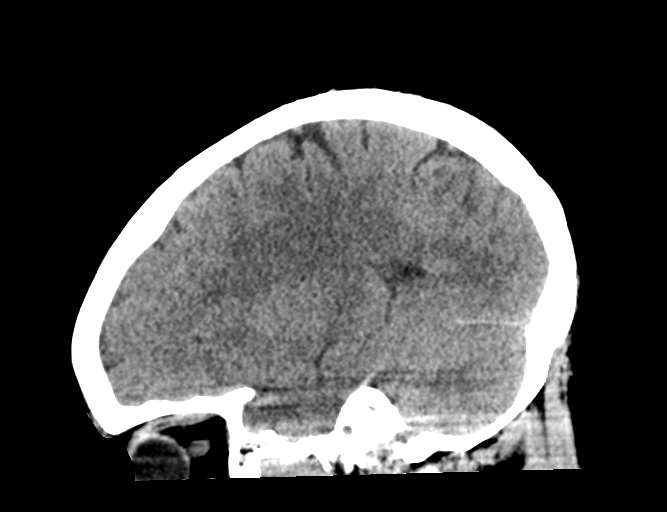
[im 28/55  brain]
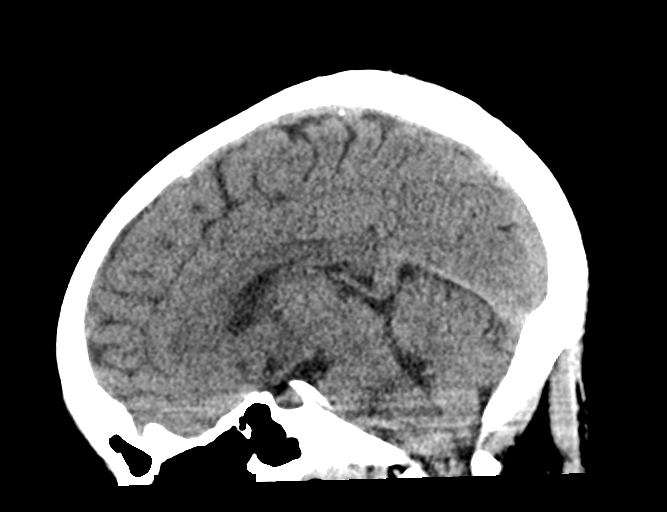
[im 37/55  brain]
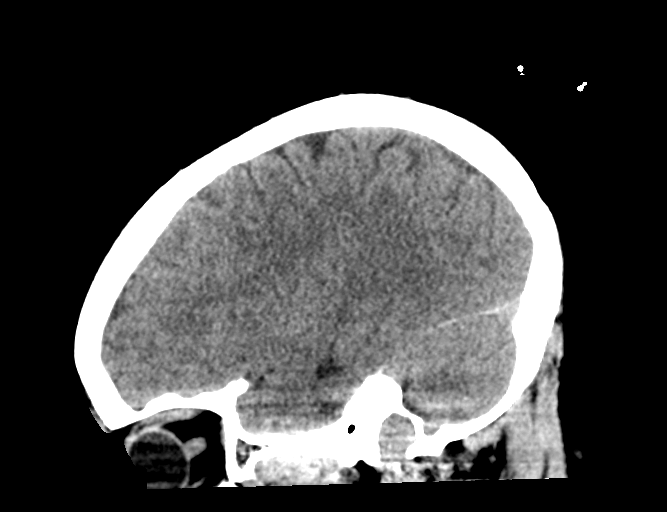

[15 of 47 positions shown; findings below may reference images not displayed]

FINDINGS: Brain: No evidence of acute infarction, hemorrhage, hydrocephalus,
extra-axial collection or mass lesion/mass effect.

Vascular: No hyperdense vessel or unexpected calcification.

Skull: Normal. Negative for fracture or focal lesion.

Sinuses/Orbits: No acute finding.

Other: None
IMPRESSION: Normal noncontrast CT of the brain.

## 2023-09-27 ENCOUNTER — Encounter (HOSPITAL_COMMUNITY): Payer: Self-pay

## 2023-09-27 ENCOUNTER — Ambulatory Visit (HOSPITAL_COMMUNITY)
Admission: EM | Admit: 2023-09-27 | Discharge: 2023-09-27 | Disposition: A | Discharge status: Home / Self Care | Payer: Medicaid Other | Attending: Emergency Medicine | Admitting: Emergency Medicine

## 2023-09-27 DIAGNOSIS — F419 Anxiety disorder, unspecified: Secondary | ICD-10-CM

## 2023-09-27 DIAGNOSIS — R142 Eructation: Secondary | ICD-10-CM

## 2023-09-27 MED ORDER — HYDROXYZINE HCL 25 MG PO TABS: 25.0000 mg | ORAL_TABLET | Freq: Three times a day (TID) | ORAL | 0 refills | Status: AC | PRN

## 2023-09-27 MED ORDER — FAMOTIDINE 20 MG PO TABS: 20.0000 mg | ORAL_TABLET | Freq: Two times a day (BID) | ORAL | 0 refills | Status: DC

## 2023-09-27 NOTE — ED Triage Notes (Addendum)
Patient reports that she has been having a lot of "burping" and states when she is ot drinking alcohol this seems to follow the next day. Patient states states when she burps she has chest tightness and states at times she has arm numbness. Patient states last night she felt heaviness. Patient denies any discomfort during triage.  Patient states she drinks 2-3 times a week and added that she is very stressed and anxious. Patient states she feels extremely anxious today due to school,etc.

## 2023-09-27 NOTE — Discharge Instructions (Addendum)
You can try the hydroxyzine up to three times daily if needed for anxiety symptoms. This medication might make you sleepy!  I recommend to follow up with the behavioral health urgent care - they are open 24/7 and are a great resource.   You can also take the pepcid 2 times daily to help with burping and reflux symptoms.

## 2023-09-27 NOTE — ED Provider Notes (Signed)
[facility]    CSN: [id number] Arrival date & time: 09/27/23  0807     History   Chief Complaint Chief Complaint  Patient presents with   Burping    HPI Joyce Hodge is a 22 y.o. female.  Last night and this morning having sensation of her heart beating strongly. It is not fast or skipping beats. She is just aware of her heartbeat. Not having chest pain or shortness of breath.  Reports this has occurred a few times over the last several months. Reports increased stress. Reports anxiety from school and work. Has an important test coming up tomorrow.  She consumes alcohol 2-3 times a week.   No alcohol last night. Denies other known triggers  Brother has hx of anxiety. Patient thinks she does as well but has never been dx.  Also reports frequent burping Happens mostly the day after drinking alcohol She is not having abdominal pain, nausea or vomiting Normal BM and urination  No hx reflux. No medications taken   Past Medical History:  Diagnosis Date   UTI (urinary tract infection)     Patient Active Problem List   Diagnosis Date Noted   Postpartum examination following vaginal delivery 06/17/2020   Contraception management 06/17/2020   Alpha thalassemia silent carrier 10/30/2019    Past Surgical History:  Procedure Laterality Date   MOUTH SURGERY     wisdom teeth removal    OB History     Gravida  1   Para  1   Term  1   Preterm      AB      Living  1      SAB      IAB      Ectopic      Multiple  0   Live Births  1            Home Medications    Prior to Admission medications   Medication Sig Start Date End Date Taking? Authorizing Provider  famotidine (PEPCID) 20 MG tablet Take 1 tablet (20 mg total) by mouth 2 (two) times daily. 09/27/23  Yes Chinwe Lope, Lurena Joiner, PA-C  hydrOXYzine (ATARAX) 25 MG tablet Take 1 tablet (25 mg total) by mouth 3 (three) times daily as needed. 09/27/23  Yes Chong January, Lurena Joiner, PA-C    Family  History Family History  Problem Relation Age of Onset   Healthy Mother    Healthy Father     Social History Social History   Tobacco Use   Smoking status: Former    Current packs/day: 0.00    Types: Cigars, Cigarettes    Quit date: 08/13/2019    Years since quitting: 4.1   Smokeless tobacco: Never  Vaping Use   Vaping status: Every Day   Substances: Nicotine, Flavoring  Substance Use Topics   Alcohol use: Yes   Drug use: Never     Allergies   Patient has no known allergies.   Review of Systems Review of Systems Per HPI  Physical Exam Triage Vital Signs ED Triage Vitals  Encounter Vitals Group     BP 09/27/23 0858 (!) 124/98     Systolic BP Percentile --      Diastolic BP Percentile --      Pulse Rate 09/27/23 0858 75     Resp 09/27/23 0858 16     Temp 09/27/23 0858 98.5 F (36.9 C)     Temp Source 09/27/23 0858 Oral     SpO2 09/27/23 0858  100 %     Weight --      Height --      Head Circumference --      Peak Flow --      Pain Score 09/27/23 0901 0     Pain Loc --      Pain Education --      Exclude from Growth Chart --    No data found.  Updated Vital Signs BP (!) 124/98 (BP Location: Right Arm)   Pulse 75   Temp 98.5 F (36.9 C) (Oral)   Resp 16   LMP 09/05/2023 (Approximate)   SpO2 100%   Physical Exam Vitals and nursing note reviewed.  Constitutional:      General: She is not in acute distress.    Appearance: Normal appearance. She is not ill-appearing.     Comments: Well appearing. No acute distress. Sitting comfortable in chair  HENT:     Mouth/Throat:     Mouth: Mucous membranes are moist.     Pharynx: Oropharynx is clear.  Eyes:     Conjunctiva/sclera: Conjunctivae normal.  Cardiovascular:     Rate and Rhythm: Normal rate and regular rhythm.     Pulses: Normal pulses.     Heart sounds: Normal heart sounds. No murmur heard.    Comments: Regular rate and rhythm. 78 BPM auscultated. No extrasystoles  Pulmonary:     Effort:  Pulmonary effort is normal.     Breath sounds: Normal breath sounds.  Abdominal:     General: There is no distension.     Palpations: Abdomen is soft. There is no mass.     Tenderness: There is no abdominal tenderness.  Musculoskeletal:        General: Normal range of motion.     Cervical back: Normal range of motion.  Skin:    General: Skin is warm and dry.  Neurological:     Mental Status: She is alert and oriented to person, place, and time.    UC Treatments / Results  Labs (all labs ordered are listed, but only abnormal results are displayed) Labs Reviewed - No data to display  EKG  Radiology No results found.  Procedures Procedures   Medications Ordered in UC Medications - No data to display  Initial Impression / Assessment and Plan / UC Course  I have reviewed the triage vital signs and the nursing notes.  Pertinent labs & imaging results that were available during my care of the patient were reviewed by me and considered in my medical decision making (see chart for details).  Stable vitals. Well appearing. Heart sounds RRR. She denies palpitations, chest pain or tightness, or shortness of breath.  Suspect anxiety, related to life stressors of work and school Patient admits to increased stress recently. Low concern for underlying cardiac abnormality at this time. Trial of hydroxyzine TID prn Pepcid BID, decrease alcohol consumption for presumed reflux/increased stomach acid causing burping. Provided with [facility] information for further discussion on substance abuse and anxiety management No red flags today Patient agreeable to plan, all questions answered   Final Clinical Impressions(s) / UC Diagnoses   Final diagnoses:  Anxiety  Burping     Discharge Instructions      You can try the hydroxyzine up to three times daily if needed for anxiety symptoms. This medication might make you sleepy!  I recommend to follow up with the behavioral health urgent care  - they are open 24/7 and are a great resource.  You can also take the pepcid 2 times daily to help with burping and reflux symptoms.      ED Prescriptions     Medication Sig Dispense Auth. Provider   hydrOXYzine (ATARAX) 25 MG tablet Take 1 tablet (25 mg total) by mouth 3 (three) times daily as needed. 30 tablet Jak Haggar, PA-C   famotidine (PEPCID) 20 MG tablet Take 1 tablet (20 mg total) by mouth 2 (two) times daily. 60 tablet Jacinda Kanady, Lurena Joiner, PA-C      PDMP not reviewed this encounter.   Marlow Baars, [place] 09/27/23 1191

## 2023-10-24 ENCOUNTER — Telehealth: Payer: Medicaid Other

## 2023-10-24 ENCOUNTER — Telehealth: Payer: Medicaid Other | Admitting: Physician Assistant

## 2023-10-24 DIAGNOSIS — R3989 Other symptoms and signs involving the genitourinary system: Secondary | ICD-10-CM

## 2023-10-24 MED ORDER — NITROFURANTOIN MONOHYD MACRO 100 MG PO CAPS
100.0000 mg | ORAL_CAPSULE | Freq: Two times a day (BID) | ORAL | 0 refills | Status: DC
Start: 2023-10-24 — End: 2023-10-27

## 2023-10-24 NOTE — Patient Instructions (Signed)
  Joyce Hodge, thank you for joining [name], PA-C for today's virtual visit.  While this provider is not your primary care provider (PCP), if your PCP is located in our provider database this encounter information will be shared with them immediately following your visit.   A [facility] MyChart account gives you access to today's visit and all your visits, tests, and labs performed at [facility] " click here if you don't have a [facility] MyChart account or go to [email or web address]  Consent: (Patient) Joyce Hodge provided verbal consent for this virtual visit at the beginning of the encounter.  Current Medications:  Current Outpatient Medications:    nitrofurantoin, macrocrystal-monohydrate, (MACROBID) 100 MG capsule, Take 1 capsule (100 mg total) by mouth 2 (two) times daily., Disp: 10 capsule, Rfl: 0   famotidine (PEPCID) 20 MG tablet, Take 1 tablet (20 mg total) by mouth 2 (two) times daily., Disp: 60 tablet, Rfl: 0   hydrOXYzine (ATARAX) 25 MG tablet, Take 1 tablet (25 mg total) by mouth 3 (three) times daily as needed., Disp: 30 tablet, Rfl: 0   Medications ordered in this encounter:  Meds ordered this encounter  Medications   nitrofurantoin, macrocrystal-monohydrate, (MACROBID) 100 MG capsule    Sig: Take 1 capsule (100 mg total) by mouth 2 (two) times daily.    Dispense:  10 capsule    Refill:  0    Order Specific Question:   Supervising Provider    Answer:   Merrilee Jansky [id number]     *If you need refills on other medications prior to your next appointment, please contact your pharmacy*  Follow-Up: Call back or seek an in-person evaluation if the symptoms worsen or if the condition fails to improve as anticipated.  [facility] [phone]  Other Instructions  Vaginal Probiotics: AZO vaginal probiotic OLLY Happy Hoo-Ha RAW Vaginal Care RenewLife Women's vaginal probiotic RepHresh Pro-B  Vaginal  washes: Honey Pot Summer's Eve Vagisil Feminine cleanser    If you have been instructed to have an in-person evaluation today at a local Urgent Care facility, please use the link below. It will take you to a list of all of our available [facility], including address, phone number and hours of operation. Please do not delay care.  [facility]  If you or a family member do not have a primary care provider, use the link below to schedule a visit and establish care. When you choose a [facility] primary care physician or advanced practice provider, you gain a long-term partner in health. Find a Primary Care Provider  Learn more about [facility]'s in-office and virtual care options: [facility] - Get Care Now

## 2023-10-24 NOTE — Progress Notes (Signed)
Virtual Visit Consent   Joyce Hodge, you are scheduled for a virtual visit with a [facility] provider today. Just as with appointments in the office, your consent must be obtained to participate. Your consent will be active for this visit and any virtual visit you may have with one of our providers in the next 365 days. If you have a MyChart account, a copy of this consent can be sent to you electronically.  As this is a virtual visit, video technology does not allow for your provider to perform a traditional examination. This may limit your provider's ability to fully assess your condition. If your provider identifies any concerns that need to be evaluated in person or the need to arrange testing (such as labs, EKG, etc.), we will make arrangements to do so. Although advances in technology are sophisticated, we cannot ensure that it will always work on either your end or our end. If the connection with a video visit is poor, the visit may have to be switched to a telephone visit. With either a video or telephone visit, we are not always able to ensure that we have a secure connection.  By engaging in this virtual visit, you consent to the provision of healthcare and authorize for your insurance to be billed (if applicable) for the services provided during this visit. Depending on your insurance coverage, you may receive a charge related to this service.  I need to obtain your verbal consent now. Are you willing to proceed with your visit today? Joyce Hodge has provided verbal consent on 10/24/2023 for a virtual visit (video or telephone). [name], PA-C  Date: 10/24/2023 5:57 PM  Virtual Visit via Video Note   I, [name], connected with  Joyce Hodge  ([id number], Sep 08, 2001) on 10/24/23 at  6:00 PM EDT by a video-enabled telemedicine application and verified that I am speaking with the correct person using two identifiers.  Location: Patient: Virtual Visit  Location Patient: Home Provider: Virtual Visit Location Provider: Home Office   I discussed the limitations of evaluation and management by telemedicine and the availability of in person appointments. The patient expressed understanding and agreed to proceed.    History of Present Illness: Joyce Hodge is a 22 y.o. who identifies as a female who was assigned female at birth, and is being seen today for possible UTI, urine frequency.  HPI: Urinary Tract Infection  This is a new problem. The current episode started 1 to 4 weeks ago (just over a week). The problem occurs every urination. The problem has been gradually worsening. The quality of the pain is described as aching. The pain is mild. There has been no fever. She is Sexually active. There is No history of pyelonephritis. Associated symptoms include frequency and urgency. Pertinent negatives include no chills, flank pain, hematuria, hesitancy, nausea, possible pregnancy or vomiting. Associated symptoms comments: Urine had mild odor, cloudy, mild suprapubic pressure. She has tried increased fluids for the symptoms. The treatment provided no relief. There is no history of recurrent UTIs.     Problems:  Patient Active Problem List   Diagnosis Date Noted   Postpartum examination following vaginal delivery 06/17/2020   Contraception management 06/17/2020   Alpha thalassemia silent carrier 10/30/2019    Allergies: No Known Allergies Medications:  Current Outpatient Medications:    nitrofurantoin, macrocrystal-monohydrate, (MACROBID) 100 MG capsule, Take 1 capsule (100 mg total) by mouth 2 (two) times daily., Disp: 10 capsule, Rfl: 0  famotidine (PEPCID) 20 MG tablet, Take 1 tablet (20 mg total) by mouth 2 (two) times daily., Disp: 60 tablet, Rfl: 0   hydrOXYzine (ATARAX) 25 MG tablet, Take 1 tablet (25 mg total) by mouth 3 (three) times daily as needed., Disp: 30 tablet, Rfl: 0  Observations/Objective: Patient is well-developed,  well-nourished in no acute distress.  Resting comfortably at home.  Head is normocephalic, atraumatic.  No labored breathing.  Speech is clear and coherent with logical content.  Patient is alert and oriented at baseline.    Assessment and Plan: 1. Suspected UTI - nitrofurantoin, macrocrystal-monohydrate, (MACROBID) 100 MG capsule; Take 1 capsule (100 mg total) by mouth 2 (two) times daily.  Dispense: 10 capsule; Refill: 0  - Worsening symptoms.  - Will treat empirically with Macrobid - May use AZO for bladder spasms - Continue to push fluids.  - Seek in person evaluation for urine culture if symptoms do not improve or if they worsen.     Follow Up Instructions: I discussed the assessment and treatment plan with the patient. The patient was provided an opportunity to ask questions and all were answered. The patient agreed with the plan and demonstrated an understanding of the instructions.  A copy of instructions were sent to the patient via MyChart unless otherwise noted below.    The patient was advised to call back or seek an in-person evaluation if the symptoms worsen or if the condition fails to improve as anticipated.    [name], PA-C

## 2023-10-27 ENCOUNTER — Ambulatory Visit: Payer: Medicaid Other | Admitting: Family Medicine

## 2023-10-27 ENCOUNTER — Encounter: Payer: Self-pay | Admitting: Family Medicine

## 2023-10-27 VITALS — BP 110/78 | HR 85 | Temp 98.1°F | Ht 62.0 in | Wt 171.0 lb

## 2023-10-27 DIAGNOSIS — L709 Acne, unspecified: Secondary | ICD-10-CM

## 2023-10-27 DIAGNOSIS — E559 Vitamin D deficiency, unspecified: Secondary | ICD-10-CM | POA: Diagnosis not present

## 2023-10-27 DIAGNOSIS — Z7689 Persons encountering health services in other specified circumstances: Secondary | ICD-10-CM

## 2023-10-27 DIAGNOSIS — E66811 Obesity, class 1: Secondary | ICD-10-CM

## 2023-10-27 DIAGNOSIS — R5383 Other fatigue: Secondary | ICD-10-CM

## 2023-10-27 DIAGNOSIS — F411 Generalized anxiety disorder: Secondary | ICD-10-CM

## 2023-10-27 DIAGNOSIS — Z6831 Body mass index (BMI) 31.0-31.9, adult: Secondary | ICD-10-CM

## 2023-10-27 DIAGNOSIS — E6609 Other obesity due to excess calories: Secondary | ICD-10-CM

## 2023-10-27 MED ORDER — ADAPALENE 0.1 % EX CREA
TOPICAL_CREAM | CUTANEOUS | 1 refills | Status: DC
Start: 2023-10-27 — End: 2023-12-29

## 2023-10-27 MED ORDER — ESCITALOPRAM OXALATE 10 MG PO TABS
10.0000 mg | ORAL_TABLET | Freq: Every day | ORAL | 2 refills | Status: DC
Start: 2023-10-27 — End: 2023-12-29

## 2023-10-27 NOTE — Assessment & Plan Note (Addendum)
Added an  SSRI to Hydroxyzine 25 mg TID PRN

## 2023-10-27 NOTE — Progress Notes (Signed)
I,Jameka J Llittleton, CMA,acting as a Neurosurgeon for [employer], NP.,have documented all relevant documentation on the behalf of Ellender Hose, NP,as directed by  Ellender Hose, NP while in the presence of Ellender Hose, NP.  Subjective:  Patient ID: Joyce Hodge , female    DOB: 12/21/2001 , 22 y.o.   MRN: [id number]  Chief Complaint  Patient presents with   Establish Care   Acne    HPI  Patient presents today to establish care. She would like to be seen for her acne. She states she is using a face wash, moisturizer and sunscreen daily but it  is not helping her. She would like to know what else she can use and also would like a referral to a dermatologist.  Patient states that she went to urgent care on 09/27/2023 with c/o palpitation, she was diagnosed with anxiety and put on hydroxyzine 25mg  TID PRN.She states it helps but it makes her sleepy so she takes it at night. Patient states she has a toddler who is autistic and she just started a new job and all these makes her anxious.     Past Medical History:  Diagnosis Date   Anxiety    UTI (urinary tract infection)      Family History  Problem Relation Age of Onset   Healthy Mother    Healthy Father      Current Outpatient Medications:    adapalene (DIFFERIN) 0.1 % cream, Apply to affected area nightly, Disp: 45 g, Rfl: 1   escitalopram (LEXAPRO) 10 MG tablet, Take 1 tablet (10 mg total) by mouth daily., Disp: 30 tablet, Rfl: 2   famotidine (PEPCID) 20 MG tablet, Take 20 mg by mouth 2 (two) times daily., Disp: , Rfl:    hydrOXYzine (ATARAX) 25 MG tablet, Take 1 tablet (25 mg total) by mouth 3 (three) times daily as needed., Disp: 30 tablet, Rfl: 0   No Known Allergies   Review of Systems  Constitutional: Negative.   Eyes: Negative.   Respiratory: Negative.    Cardiovascular: Negative.   Musculoskeletal: Negative.   Skin:  Positive for color change.       Acne on both sides of face  Psychiatric/Behavioral:  The patient is  nervous/anxious.      Today's Vitals   10/27/23 0837  BP: 110/78  Pulse: 85  Temp: 98.1 F (36.7 C)  Weight: 171 lb (77.6 kg)  Height: 5\' 2"  (1.575 m)  PainSc: 0-No pain   Body mass index is 31.28 kg/m.  Wt Readings from Last 3 Encounters:  10/27/23 171 lb (77.6 kg)  06/16/23 158 lb (71.7 kg)  10/05/22 160 lb (72.6 kg)    The ASCVD Risk score (Arnett DK, et al., 2019) failed to calculate for the following reasons:   The 2019 ASCVD risk score is only valid for ages 61 to 39  Objective:  Physical Exam HENT:     Head: Normocephalic.  Cardiovascular:     Rate and Rhythm: Normal rate and regular rhythm.  Pulmonary:     Effort: Pulmonary effort is normal.     Breath sounds: Normal breath sounds.  Skin:    General: Skin is warm and dry.     Findings: Lesion present.     Comments: acne  Neurological:     Mental Status: She is alert.  Psychiatric:        Mood and Affect: Mood normal.        Behavior: Behavior normal.  Assessment And Plan:  Encounter to establish care with new doctor  Acne, unspecified acne type -     Adapalene; Apply to affected area nightly  Dispense: 45 g; Refill: 1 -     Ambulatory referral to Dermatology  GAD (generalized anxiety disorder) Assessment & Plan: Added an  SSRI to Hydroxyzine 25 mg TID PRN  Orders: -     Escitalopram Oxalate; Take 1 tablet (10 mg total) by mouth daily.  Dispense: 30 tablet; Refill: 2  Fatigue, unspecified type -     BMP8+eGFR -     CBC -     TSH  Vitamin D deficiency -     VITAMIN D 25 Hydroxy (Vit-D Deficiency, Fractures)  Class 1 obesity due to excess calories with serious comorbidity and body mass index (BMI) of 31.0 to 31.9 in adult Assessment & Plan: She is encouraged to strive for BMI less than 30 to decrease cardiac risk. Advised to aim for at least 150 minutes of exercise per week.      Return in 6 weeks (on 12/08/2023), or if symptoms worsen or fail to improve, for follow up depression .   Patient was given opportunity to ask questions. Patient verbalized understanding of the plan and was able to repeat key elements of the plan. All questions were answered to their satisfaction.    I, Ellender Hose, NP, have reviewed all documentation for this visit. The documentation on 10/27/2023 for the exam, diagnosis, procedures, and orders are all accurate and complete.      IF YOU HAVE BEEN REFERRED TO A SPECIALIST, IT MAY TAKE 1-2 WEEKS TO SCHEDULE/PROCESS THE REFERRAL. IF YOU HAVE NOT HEARD FROM US/SPECIALIST IN TWO WEEKS, PLEASE GIVE [place] A CALL AT [phone].

## 2023-10-27 NOTE — Assessment & Plan Note (Signed)
She is encouraged to strive for BMI less than 30 to decrease cardiac risk. Advised to aim for at least 150 minutes of exercise per week.

## 2023-10-28 LAB — BMP8+EGFR
BUN/Creatinine Ratio: 16 (ref 9–23)
BUN: 11 mg/dL (ref 6–20)
CO2: 24 mmol/L (ref 20–29)
Calcium: 9.5 mg/dL (ref 8.7–10.2)
Chloride: 103 mmol/L (ref 96–106)
Creatinine, Ser: 0.68 mg/dL (ref 0.57–1.00)
Glucose: 82 mg/dL (ref 70–99)
Potassium: 4.4 mmol/L (ref 3.5–5.2)
Sodium: 138 mmol/L (ref 134–144)
eGFR: 126 mL/min/{1.73_m2} (ref >59)

## 2023-10-28 LAB — CBC
Hematocrit: 41.8 % (ref 34.0–46.6)
Hemoglobin: 12.9 g/dL (ref 11.1–15.9)
MCH: 28.6 pg (ref 26.6–33.0)
MCHC: 30.9 g/dL — ABNORMAL LOW (ref 31.5–35.7)
MCV: 93 fL (ref 79–97)
Platelets: 202 10*3/uL (ref 150–450)
RBC: 4.51 x10E6/uL (ref 3.77–5.28)
RDW: 12.5 % (ref 11.7–15.4)
WBC: 4.4 10*3/uL (ref 3.4–10.8)

## 2023-10-28 LAB — VITAMIN D 25 HYDROXY (VIT D DEFICIENCY, FRACTURES): Vit D, 25-Hydroxy: 18.4 ng/mL — ABNORMAL LOW (ref 30.0–100.0)

## 2023-10-28 LAB — TSH: TSH: 1.5 u[IU]/mL (ref 0.450–4.500)

## 2023-11-01 ENCOUNTER — Other Ambulatory Visit: Payer: Self-pay | Admitting: Family Medicine

## 2023-11-01 MED ORDER — VITAMIN D (ERGOCALCIFEROL) 1.25 MG (50000 UNIT) PO CAPS: 50000.0000 [IU] | ORAL_CAPSULE | ORAL | 2 refills | Status: AC

## 2023-12-08 ENCOUNTER — Ambulatory Visit: Payer: Medicaid Other | Admitting: Family Medicine

## 2023-12-23 ENCOUNTER — Ambulatory Visit: Payer: Medicaid Other | Admitting: Family Medicine

## 2023-12-29 ENCOUNTER — Ambulatory Visit: Payer: Medicaid Other | Admitting: Family Medicine

## 2023-12-29 ENCOUNTER — Encounter: Payer: Self-pay | Admitting: Family Medicine

## 2023-12-29 VITALS — BP 110/70 | HR 91 | Temp 98.4°F | Ht 63.0 in | Wt 178.2 lb

## 2023-12-29 DIAGNOSIS — L700 Acne vulgaris: Secondary | ICD-10-CM

## 2023-12-29 DIAGNOSIS — F411 Generalized anxiety disorder: Secondary | ICD-10-CM

## 2023-12-29 DIAGNOSIS — Z6831 Body mass index (BMI) 31.0-31.9, adult: Secondary | ICD-10-CM | POA: Diagnosis not present

## 2023-12-29 DIAGNOSIS — E66811 Obesity, class 1: Secondary | ICD-10-CM

## 2023-12-29 DIAGNOSIS — E6609 Other obesity due to excess calories: Secondary | ICD-10-CM | POA: Diagnosis not present

## 2023-12-29 MED ORDER — ESCITALOPRAM OXALATE 10 MG PO TABS
10.0000 mg | ORAL_TABLET | Freq: Every day | ORAL | 2 refills | Status: DC
Start: 2023-12-29 — End: 2024-07-04

## 2023-12-29 MED ORDER — DOXYCYCLINE HYCLATE 50 MG PO CAPS
50.0000 mg | ORAL_CAPSULE | Freq: Two times a day (BID) | ORAL | 0 refills | Status: DC
Start: 2023-12-29 — End: 2024-01-26

## 2023-12-29 MED ORDER — TRETINOIN 0.1 % EX CREA
TOPICAL_CREAM | Freq: Every day | CUTANEOUS | 1 refills | Status: AC
Start: 2023-12-29 — End: ?

## 2023-12-29 NOTE — Progress Notes (Signed)
 I,Jameka J Llittleton, CMA,acting as a neurosurgeon for [employer], NP.,have documented all relevant documentation on the behalf of Bruna Creighton, NP,as directed by  Bruna Creighton, NP while in the presence of Bruna Creighton, NP.  Subjective:  Patient ID: Joyce Hodge , female    DOB: 2001/01/28 , 23 y.o.   MRN: [id number]  Chief Complaint  Patient presents with   Depression   Anxiety    HPI  Patient is a 23 year old who presents today for generalized  anxiety disorder follow up. Patient was prescribed Escitalopram  10 mg every day 10/27/2023 and she states he can notice gradual improvement in her symptoms, patient does admit though that she is not completely compliant because she sometimes forgets to take her medicine. Also, patient states that she does not think that Differin  cream is helping the acne on her face, she states acne still gets inflamed especially around her monthly periods.Patient was referred to dermatology last visit but states she has not heard back from them.      Past Medical History:  Diagnosis Date   Anxiety    UTI (urinary tract infection)      Family History  Problem Relation Age of Onset   Healthy Mother    Healthy Father      Current Outpatient Medications:    doxycycline  (VIBRAMYCIN ) 50 MG capsule, Take 1 capsule (50 mg total) by mouth 2 (two) times daily., Disp: 60 capsule, Rfl: 0   famotidine  (PEPCID ) 20 MG tablet, Take 20 mg by mouth 2 (two) times daily., Disp: , Rfl:    tretinoin  (RETIN-A ) 0.1 % cream, Apply topically at bedtime., Disp: 45 g, Rfl: 1   Vitamin D , Ergocalciferol , (DRISDOL ) 1.25 MG (50000 UNIT) CAPS capsule, Take 1 capsule (50,000 Units total) by mouth every 7 (seven) days., Disp: 5 capsule, Rfl: 2   escitalopram  (LEXAPRO ) 10 MG tablet, Take 1 tablet (10 mg total) by mouth daily., Disp: 90 tablet, Rfl: 2   hydrOXYzine  (ATARAX ) 25 MG tablet, Take 1 tablet (25 mg total) by mouth 3 (three) times daily as needed. (Patient not taking: Reported on  12/29/2023), Disp: 30 tablet, Rfl: 0   No Known Allergies   Review of Systems  Constitutional: Negative.   HENT: Negative.    Eyes: Negative.   Respiratory: Negative.    Cardiovascular: Negative.   Musculoskeletal: Negative.   Skin:  Positive for rash.  Psychiatric/Behavioral:  Positive for depression. The patient is nervous/anxious.      Today's Vitals   12/29/23 0830  BP: 110/70  Pulse: 91  Temp: 98.4 F (36.9 C)  TempSrc: Oral  Weight: 178 lb 3.2 oz (80.8 kg)  Height: 5' 3 (1.6 m)  PainSc: 0-No pain   Body mass index is 31.57 kg/m.  Wt Readings from Last 3 Encounters:  12/29/23 178 lb 3.2 oz (80.8 kg)  10/27/23 171 lb (77.6 kg)  06/16/23 158 lb (71.7 kg)    The ASCVD Risk score (Arnett DK, et al., 2019) failed to calculate for the following reasons:   The 2019 ASCVD risk score is only valid for ages 63 to 76  Objective:  Physical Exam Constitutional:      Appearance: Normal appearance.  HENT:     Head: Normocephalic.  Pulmonary:     Effort: Pulmonary effort is normal.     Breath sounds: Normal breath sounds.  Abdominal:     General: Bowel sounds are normal.  Skin:    General: Skin is warm and dry.  Findings: Rash present.     Comments: Face had mostly blackheads with a few pustules and papules  Neurological:     Mental Status: She is alert.  Psychiatric:        Mood and Affect: Mood normal.         Assessment And Plan:  GAD (generalized anxiety disorder) -     Escitalopram  Oxalate; Take 1 tablet (10 mg total) by mouth daily.  Dispense: 90 tablet; Refill: 2  Acne vulgaris -     Doxycycline  Hyclate; Take 1 capsule (50 mg total) by mouth 2 (two) times daily.  Dispense: 60 capsule; Refill: 0 -     Tretinoin ; Apply topically at bedtime.  Dispense: 45 g; Refill: 1 -     Ambulatory referral to Dermatology  Class 1 obesity due to excess calories with serious comorbidity and body mass index (BMI) of 31.0 to 31.9 in adult Assessment & Plan: She is  encouraged to strive for BMI less than 30 to decrease cardiac risk. Advised to aim for at least 150 minutes of exercise per week.      Return in about 6 months (around 06/27/2024) for anxiety f/u, physical.  Patient was given opportunity to ask questions. Patient verbalized understanding of the plan and was able to repeat key elements of the plan. All questions were answered to their satisfaction.   I, Bruna Creighton, NP, have reviewed all documentation for this visit. The documentation on 12/29/2023 for the exam, diagnosis, procedures, and orders are all accurate and complete.     IF YOU HAVE BEEN REFERRED TO A SPECIALIST, IT MAY TAKE 1-2 WEEKS TO SCHEDULE/PROCESS THE REFERRAL. IF YOU HAVE NOT HEARD FROM US /SPECIALIST IN TWO WEEKS, PLEASE GIVE US  A CALL AT [phone].

## 2023-12-29 NOTE — Assessment & Plan Note (Signed)
 She is encouraged to strive for BMI less than 30 to decrease cardiac risk. Advised to aim for at least 150 minutes of exercise per week.

## 2024-01-26 ENCOUNTER — Other Ambulatory Visit: Payer: Self-pay | Admitting: Family Medicine

## 2024-01-26 DIAGNOSIS — L700 Acne vulgaris: Secondary | ICD-10-CM

## 2024-04-27 ENCOUNTER — Telehealth: Admitting: Family Medicine

## 2024-04-27 DIAGNOSIS — B9689 Other specified bacterial agents as the cause of diseases classified elsewhere: Secondary | ICD-10-CM | POA: Diagnosis not present

## 2024-04-27 DIAGNOSIS — N760 Acute vaginitis: Secondary | ICD-10-CM

## 2024-04-27 MED ORDER — METRONIDAZOLE 500 MG PO TABS: 500.0000 mg | ORAL_TABLET | Freq: Two times a day (BID) | ORAL | 0 refills | Status: AC

## 2024-04-27 NOTE — Progress Notes (Signed)
 Virtual Visit Consent   Joyce Hodge, you are scheduled for a virtual visit with a [facility] provider today. Just as with appointments in the office, your consent must be obtained to participate. Your consent will be active for this visit and any virtual visit you may have with one of our providers in the next 365 days. If you have a MyChart account, a copy of this consent can be sent to you electronically.  As this is a virtual visit, video technology does not allow for your provider to perform a traditional examination. This may limit your provider's ability to fully assess your condition. If your provider identifies any concerns that need to be evaluated in person or the need to arrange testing (such as labs, EKG, etc.), we will make arrangements to do so. Although advances in technology are sophisticated, we cannot ensure that it will always work on either your end or our end. If the connection with a video visit is poor, the visit may have to be switched to a telephone visit. With either a video or telephone visit, we are not always able to ensure that we have a secure connection.  By engaging in this virtual visit, you consent to the provision of healthcare and authorize for your insurance to be billed (if applicable) for the services provided during this visit. Depending on your insurance coverage, you may receive a charge related to this service.  I need to obtain your verbal consent now. Are you willing to proceed with your visit today? Joyce Hodge has provided verbal consent on 04/27/2024 for a virtual visit (video or telephone). Albertha Huger, FNP  Date: 04/27/2024 4:35 PM   Virtual Visit via Video Note   I, Albertha Huger, connected with  Joyce Hodge  ([id number], [birth year]) on 04/27/24 at  4:30 PM EDT by a video-enabled telemedicine application and verified that I am speaking with the correct person using two identifiers.  Location: Patient: Virtual Visit Location Patient:  Home Provider: Virtual Visit Location Provider: Home Office   I discussed the limitations of evaluation and management by telemedicine and the availability of in person appointments. The patient expressed understanding and agreed to proceed.    History of Present Illness: Joyce Hodge is a 23 y.o. who identifies as a female who was assigned female at birth, and is being seen today for vaginal discharge with odor and slight itching for 2 days. Aaron Aas  HPI: HPI  Problems:  Patient Active Problem List   Diagnosis Date Noted   Class 1 obesity due to excess calories with serious comorbidity and body mass index (BMI) of 31.0 to 31.9 in adult 10/27/2023   Vitamin D  deficiency 10/27/2023   Fatigue 10/27/2023   GAD (generalized anxiety disorder) 10/27/2023   Acne 10/27/2023   Postpartum examination following vaginal delivery 06/17/2020   Contraception management 06/17/2020   Alpha thalassemia silent carrier 10/30/2019    Allergies: No Known Allergies Medications:  Current Outpatient Medications:    metroNIDAZOLE  (FLAGYL ) 500 MG tablet, Take 1 tablet (500 mg total) by mouth 2 (two) times daily for 7 days., Disp: 14 tablet, Rfl: 0   doxycycline  (VIBRAMYCIN ) 50 MG capsule, TAKE 1 CAPSULE BY MOUTH 2 TIMES DAILY., Disp: 60 capsule, Rfl: 0   escitalopram  (LEXAPRO ) 10 MG tablet, Take 1 tablet (10 mg total) by mouth daily., Disp: 90 tablet, Rfl: 2   famotidine  (PEPCID ) 20 MG tablet, Take 20 mg by mouth 2 (two) times daily., Disp: , Rfl:  hydrOXYzine  (ATARAX ) 25 MG tablet, Take 1 tablet (25 mg total) by mouth 3 (three) times daily as needed. (Patient not taking: Reported on 12/29/2023), Disp: 30 tablet, Rfl: 0   tretinoin  (RETIN-A ) 0.1 % cream, Apply topically at bedtime., Disp: 45 g, Rfl: 1   Vitamin D , Ergocalciferol , (DRISDOL ) 1.25 MG (50000 UNIT) CAPS capsule, Take 1 capsule (50,000 Units total) by mouth every 7 (seven) days., Disp: 5 capsule, Rfl: 2  Observations/Objective: Patient is  well-developed, well-nourished in no acute distress.  Resting comfortably  at home.  Head is normocephalic, atraumatic.  No labored breathing.  Speech is clear and coherent with logical content.  Patient is alert and oriented at baseline.    Assessment and Plan: 1. Bacterial vaginitis (Primary)  UC if sx persist or worsen.   Follow Up Instructions: I discussed the assessment and treatment plan with the patient. The patient was provided an opportunity to ask questions and all were answered. The patient agreed with the plan and demonstrated an understanding of the instructions.  A copy of instructions were sent to the patient via MyChart unless otherwise noted below.     The patient was advised to call back or seek an in-person evaluation if the symptoms worsen or if the condition fails to improve as anticipated.    Linetta Regner, FNP

## 2024-04-27 NOTE — Patient Instructions (Signed)
 Vaginal Infection (Bacterial Vaginosis): What to Know  Bacterial vaginosis is an infection of the vagina. It happens when the balance of normal germs (bacteria) in the vagina changes. If you don't get treated, it can make it easier for you to get other infections from sex. These are called sexually transmitted infections (STIs). If you're pregnant, you need to get treated right away. This infection can cause a baby to be born early or at a low birth weight. What are the causes? This infection happens when too many harmful germs grow in the vagina. You can't get this infection from toilet seats, bedsheets, swimming pools, or things that touch your vagina. What increases the risk? Having sex with a new person or more than one person. Having sex without protection. Douching. Having an intrauterine device (IUD). Smoking. Using drugs or drinking alcohol. These can lead you to do risky things. Taking certain antibiotics. Being pregnant. What are the signs or symptoms? Some females have no symptoms. Symptoms may include: A gray or white discharge from your vagina. It can be watery or foamy. A fishy smell. This can happen after sex or during your menstrual period. Itching in and around your vagina. Burning or pain when you pee. How is this treated? This infection is treated with antibiotics. These may be given to you as: A pill. A cream for your vagina. A medicine that you put into your vagina (suppository). If the infection comes back, you may need more antibiotics. Follow these instructions at home: Medicines Take your medicines as told. Take or use your antibiotics as told. Do not stop using them even if you start to feel better. General instructions If the person you have sex with is a female, tell her that you have this infection. She will need to follow up with her doctor. Female partners don't need to be treated. Do not have sex until you finish treatment. Drink more fluids as  told. Keep your vagina and butt clean. Wash these areas with warm water each day. Wipe from front to back after you poop. If you're breastfeeding a baby, talk to your doctor if you should keep doing so during treatment. How is this prevented? Self-care Do not douche. Do not use deodorant sprays on your vagina. Wear cotton underwear. Do not wear tight pants and pantyhose, especially in the summer. Safe sex Use condoms the correct way and every time you have sex. Use dental dams to protect yourself during oral sex. Limit how many people you have sex with. Get tested for STIs. The person you have sex with should also get tested. Drugs and alcohol Do not smoke, vape, or use nicotine or tobacco. Do not use drugs. Limit the amount of alcohol you drink because it can lead you to do risky things. Where to find more information To learn more: Go to TonerPromos.no. Click Health Topics A-Z. Type "bacterial vaginosis" in the search bar. American Sexual Health Association (ASHA): ashasexualhealth.org U.S. Department of Health and [employer], Office on Women's Health: TravelLesson.ca Contact a doctor if: Your symptoms don't get better, even after treatment. You have more discharge or pain when you pee. You have a fever or chills. You have pain in your belly or in the area between your hips. You have pain during sex. You bleed from your vagina between menstrual periods. This information is not intended to replace advice given to you by your health care provider. Make sure you discuss any questions you have with your health care provider. Document  Revised: 06/01/2023 Document Reviewed: 06/01/2023 Elsevier Patient Education  2024 [employer].

## 2024-07-03 ENCOUNTER — Ambulatory Visit: Payer: Medicaid Other | Admitting: Dermatology

## 2024-07-03 ENCOUNTER — Encounter: Payer: Self-pay | Admitting: Dermatology

## 2024-07-03 VITALS — BP 113/71

## 2024-07-03 DIAGNOSIS — L810 Postinflammatory hyperpigmentation: Secondary | ICD-10-CM

## 2024-07-03 DIAGNOSIS — L700 Acne vulgaris: Secondary | ICD-10-CM | POA: Diagnosis not present

## 2024-07-03 DIAGNOSIS — L219 Seborrheic dermatitis, unspecified: Secondary | ICD-10-CM

## 2024-07-03 MED ORDER — SPIRONOLACTONE 100 MG PO TABS: 100.0000 mg | ORAL_TABLET | Freq: Every day | ORAL | 3 refills | Status: DC

## 2024-07-03 MED ORDER — CLOBETASOL PROPIONATE 0.05 % EX SOLN: 1.0000 | Freq: Two times a day (BID) | CUTANEOUS | 2 refills | Status: AC

## 2024-07-03 NOTE — Progress Notes (Signed)
   New Patient Visit   Subjective  Joyce Hodge is a 23 y.o. female who presents for the following: Acne of face. She is using tretinoin  0.1% cream every day - sometimes twice daily. She stopped taking doxycycline  because it would cause nausea sometimes. She is moisturizing with Pond's.    The following portions of the chart were reviewed this encounter and updated as appropriate: medications, allergies, medical history  Review of Systems:  No other skin or systemic complaints except as noted in HPI or Assessment and Plan.  Objective  Well appearing patient in no apparent distress; mood and affect are within normal limits.   A focused examination was performed of the following areas: Face   Relevant exam findings are noted in the Assessment and Plan.             Assessment & Plan   ACNE VULGARIS and PIH Exam: Open comedones of face w/ small brwon macules  1. Acne - Assessment: Patient has been using tretinoin  0.1% daily for approximately 6 months, prescribed by Dr. Miriam. Some skin peeling is observed, which is considered normal. Patient reports occasional deeper bumps around menstrual cycle, but overall improvement in acne condition. Previous trial of doxycycline  was unsuccessful due to gastrointestinal side effects. Current regimen appears to be effective, but adjustments are needed to address dryness and optimize treatment.  - Plan:    Continue tretinoin  0.1% topically at night, Monday through Friday    Adjust application technique:     - Apply hyaluronic acid after face washing     - Apply pea-sized amount of tretinoin      - Follow with Avene Cicalfate Cream (heavier moisturizer)    Discontinue use of Pond's moisturizer    For morning routine:     - Wash face with Aveeno cleanser     - Apply Centella Moisturizer with Sunscreen   Follow-up appointment scheduled for December to adjust regimen for seasonal changes.  2. Seborrheic Dermatitis (Scalp) -  Assessment: Patient reports long-standing scalp flaking and itching, consistent with severe seborrheic dermatitis. Current management appears inadequate.  - Plan:    Use DHS zinc shampoo at least once weekly, not more frequently than every 2 weeks     - Let shampoo sit for 2 minutes before rinsing    Apply clobetasol  solution to scalp for itching as needed    Avoid applying oils to scalp    No follow-ups on file.  I, Roseline Hutchinson, CMA, am acting as scribe for [employer], DO .   Documentation: I have reviewed the above documentation for accuracy and completeness, and I agree with the above.  Delon Lenis, DO

## 2024-07-03 NOTE — Patient Instructions (Addendum)
 Date: Tue Jul 03 2024  Hello  [place],  Thank you for visiting today. Here is a summary of the key instructions:  Medications: - Continue using tretinoin  0.1% cream at night, Monday through Friday   - Take a break from tretinoin  on weekends   - Take spironolactone  100 mg once daily (if you choose this option)  Skin Care Routine: Morning: - Wash face with Aveeno cleanser - Apply Centella Moisturizer with Sunscreen - If skin is peeling, apply Vichy Hyaluronic Acid before sunscreen  Evening (Monday-Friday): - Wash face with Aveeno cleanser - Apply Vichy Hyaluronic Acid - Apply a pea-sized amount of tretinoin  0.1% - Apply Avene Cicalfate Recovery Balm  Evening (Weekends): - Wash face with Aveeno cleanser - Apply Vichy Hyaluronic Acid - Apply Avene Cicalfate Recovery Balm  Scalp Care: - Use DHS zinc shampoo once a week to once every 2 weeks - Let shampoo sit for 2 minutes, then rinse - Apply clobetasol  solution for itching as needed  Follow-up: - Return for a follow-up appointment in December  Important Notes: - Do not use tretinoin  in the morning - Stop tretinoin  and spironolactone  if planning pregnancy - Stay hydrated while using spironolactone  - Report any lightheadedness or dizziness  We look forward to seeing you at your next visit. If you have any questions or concerns before then, please do not hesitate to contact our office.  Warm regards,  Dr. [facility]                  Important Information  Due to recent changes in healthcare laws, you may see results of your pathology and/or laboratory studies on MyChart before the doctors have had a chance to review them. We understand that in some cases there may be results that are confusing or concerning to you. Please understand that not all results are received at the same time and often the doctors may need to interpret multiple results in order to provide you with the best plan of care  or course of treatment. Therefore, we ask that you please give us  2 business days to thoroughly review all your results before contacting the office for clarification. Should we see a critical lab result, you will be contacted sooner.   If You Need Anything After Your Visit  If you have any questions or concerns for your doctor, please call our main line at [phone] If no one answers, please leave a voicemail as directed and we will return your call as soon as possible. Messages left after 4 pm will be answered the following business day.   You may also send us  a message via MyChart. We typically respond to MyChart messages within 1-2 business days.  For prescription refills, please ask your pharmacy to contact our office. Our fax number is [phone].  If you have an urgent issue when the clinic is closed that cannot wait until the next business day, you can page your doctor at the number below.    Please note that while we do our best to be available for urgent issues outside of office hours, we are not available 24/7.   If you have an urgent issue and are unable to reach us , you may choose to seek medical care at your doctor's office, retail clinic, urgent care center, or emergency room.  If you have a medical emergency, please immediately call 911 or go to the emergency department. In the event of inclement weather, please call our main line at [phone]  for an update on the status of any delays or closures.  Dermatology Medication Tips: Please keep the boxes that topical medications come in in order to help keep track of the instructions about where and how to use these. Pharmacies typically print the medication instructions only on the boxes and not directly on the medication tubes.   If your medication is too expensive, please contact our office at [phone] or send us  a message through MyChart.   We are unable to tell what your co-pay for medications will be in advance as  this is different depending on your insurance coverage. However, we may be able to find a substitute medication at lower cost or fill out paperwork to get insurance to cover a needed medication.   If a prior authorization is required to get your medication covered by your insurance company, please allow us  1-2 business days to complete this process.  Drug prices often vary depending on where the prescription is filled and some pharmacies may offer cheaper prices.  The website www.goodrx.com contains coupons for medications through different pharmacies. The prices here do not account for what the cost may be with help from insurance (it may be cheaper with your insurance), but the website can give you the price if you did not use any insurance.  - You can print the associated coupon and take it with your prescription to the pharmacy.  - You may also stop by our office during regular business hours and pick up a GoodRx coupon card.  - If you need your prescription sent electronically to a different pharmacy, notify our office through [facility] or by phone at [phone]

## 2024-07-04 ENCOUNTER — Encounter: Payer: Self-pay | Admitting: Family Medicine

## 2024-07-04 ENCOUNTER — Encounter: Payer: Self-pay | Admitting: Dermatology

## 2024-07-04 ENCOUNTER — Ambulatory Visit (INDEPENDENT_AMBULATORY_CARE_PROVIDER_SITE_OTHER): Payer: Medicaid Other | Admitting: Family Medicine

## 2024-07-04 VITALS — BP 116/80 | HR 74 | Temp 98.2°F | Ht 63.0 in | Wt 185.0 lb

## 2024-07-04 DIAGNOSIS — Z6832 Body mass index (BMI) 32.0-32.9, adult: Secondary | ICD-10-CM

## 2024-07-04 DIAGNOSIS — F411 Generalized anxiety disorder: Secondary | ICD-10-CM | POA: Diagnosis not present

## 2024-07-04 DIAGNOSIS — Z113 Encounter for screening for infections with a predominantly sexual mode of transmission: Secondary | ICD-10-CM | POA: Diagnosis not present

## 2024-07-04 DIAGNOSIS — Z1159 Encounter for screening for other viral diseases: Secondary | ICD-10-CM | POA: Insufficient documentation

## 2024-07-04 DIAGNOSIS — Z114 Encounter for screening for human immunodeficiency virus [HIV]: Secondary | ICD-10-CM | POA: Diagnosis not present

## 2024-07-04 DIAGNOSIS — Z0000 Encounter for general adult medical examination without abnormal findings: Secondary | ICD-10-CM | POA: Diagnosis not present

## 2024-07-04 DIAGNOSIS — Z136 Encounter for screening for cardiovascular disorders: Secondary | ICD-10-CM | POA: Insufficient documentation

## 2024-07-04 DIAGNOSIS — E66811 Obesity, class 1: Secondary | ICD-10-CM | POA: Insufficient documentation

## 2024-07-04 DIAGNOSIS — E6609 Other obesity due to excess calories: Secondary | ICD-10-CM

## 2024-07-04 MED ORDER — ESCITALOPRAM OXALATE 10 MG PO TABS
10.0000 mg | ORAL_TABLET | Freq: Every day | ORAL | 2 refills | Status: AC
Start: 2024-07-04 — End: 2025-03-31

## 2024-07-04 NOTE — Patient Instructions (Signed)

## 2024-07-04 NOTE — Progress Notes (Signed)
 I,Jameka J Llittleton, CMA,acting as a Neurosurgeon for [employer], NP.,have documented all relevant documentation on the behalf of Bruna Creighton, NP,as directed by  Bruna Creighton, NP while in the presence of Bruna Creighton, NP.  Subjective:    Patient ID: Joyce Hodge Resides , female    DOB: 05-25-2001 , 23 y.o.   MRN: [id number]  Chief Complaint  Patient presents with   Annual Exam    HPI  Patient is a 23 year old female who presents today for her annual physical examination. Patient reports she has been trying to lose weight by exercising daily by walking daily and going to the gym twice daily. She is on Escitalopram  10 mg every day for generalized anxiety and she states that it is working for her.      Past Medical History:  Diagnosis Date   Anxiety    UTI (urinary tract infection)      Family History  Problem Relation Age of Onset   Healthy Mother    Healthy Father      Current Outpatient Medications:    clobetasol  (TEMOVATE ) 0.05 % external solution, Apply 1 Application topically 2 (two) times daily. Apply to scalp daily as needed for itch, Disp: 50 mL, Rfl: 2   doxycycline  (VIBRAMYCIN ) 50 MG capsule, TAKE 1 CAPSULE BY MOUTH 2 TIMES DAILY., Disp: 60 capsule, Rfl: 0   famotidine  (PEPCID ) 20 MG tablet, Take 20 mg by mouth 2 (two) times daily., Disp: , Rfl:    hydrOXYzine  (ATARAX ) 25 MG tablet, Take 1 tablet (25 mg total) by mouth 3 (three) times daily as needed., Disp: 30 tablet, Rfl: 0   spironolactone  (ALDACTONE ) 100 MG tablet, Take 1 tablet (100 mg total) by mouth daily., Disp: 30 tablet, Rfl: 3   tretinoin  (RETIN-A ) 0.1 % cream, Apply topically at bedtime., Disp: 45 g, Rfl: 1   Vitamin D , Ergocalciferol , (DRISDOL ) 1.25 MG (50000 UNIT) CAPS capsule, Take 1 capsule (50,000 Units total) by mouth every 7 (seven) days., Disp: 5 capsule, Rfl: 2   escitalopram  (LEXAPRO ) 10 MG tablet, Take 1 tablet (10 mg total) by mouth daily., Disp: 90 tablet, Rfl: 2   No Known Allergies    The patient  states she uses none for birth control. Patient's last menstrual period was 06/15/2024.. Negative for Dysmenorrhea.  Social History   Tobacco Use  Smoking Status Former   Current packs/day: 0.00   Types: Cigars, Cigarettes   Quit date: 08/13/2019   Years since quitting: 4.8  Smokeless Tobacco Never   Social History   Substance and Sexual Activity  Alcohol Use Yes    Review of Systems  Constitutional: Negative.   HENT: Negative.    Eyes: Negative.   Respiratory: Negative.    Cardiovascular: Negative.   Gastrointestinal: Negative.   Endocrine: Negative.   Genitourinary: Negative.   Musculoskeletal: Negative.   Skin: Negative.   Allergic/Immunologic: Negative.   Neurological: Negative.   Hematological: Negative.   Psychiatric/Behavioral: Negative.       Today's Vitals   07/04/24 0830  BP: 116/80  Pulse: 74  Temp: 98.2 F (36.8 C)  TempSrc: Oral  Weight: 185 lb (83.9 kg)  Height: 5' 3 (1.6 m)  PainSc: 0-No pain   Body mass index is 32.77 kg/m.  Wt Readings from Last 3 Encounters:  07/04/24 185 lb (83.9 kg)  12/29/23 178 lb 3.2 oz (80.8 kg)  10/27/23 171 lb (77.6 kg)     Objective:  Physical Exam Constitutional:      Appearance:  Normal appearance.  HENT:     Head: Normocephalic.  Cardiovascular:     Rate and Rhythm: Normal rate and regular rhythm.     Pulses: Normal pulses.     Heart sounds: Normal heart sounds.  Pulmonary:     Effort: Pulmonary effort is normal.     Breath sounds: Normal breath sounds.  Abdominal:     General: Bowel sounds are normal.  Musculoskeletal:        General: Normal range of motion.  Skin:    General: Skin is warm and dry.  Neurological:     General: No focal deficit present.     Mental Status: She is alert and oriented to person, place, and time. Mental status is at baseline.  Psychiatric:        Mood and Affect: Mood normal.         Assessment And Plan:     Encounter for general adult medical examination w/o  abnormal findings  GAD (generalized anxiety disorder) Assessment & Plan: States Escitalopram  is working, continue current treatment.  Orders: -     CBC -     CMP14+EGFR -     Escitalopram  Oxalate; Take 1 tablet (10 mg total) by mouth daily.  Dispense: 90 tablet; Refill: 2  Screening examination for STD (sexually transmitted disease) -     NuSwab Vaginitis Plus (VG+) -     RPR  Encounter for hepatitis C screening test for low risk patient -     Hepatitis C antibody  Screening for HIV (human immunodeficiency virus) -     HIV Antibody (routine testing w rflx)  Encounter for screening for cardiovascular disorders Assessment & Plan: Low fat diet advised  Orders: -     Lipid panel  Class 1 obesity due to excess calories with serious comorbidity and body mass index (BMI) of 32.0 to 32.9 in adult Assessment & Plan: She is encouraged to strive for BMI less than 30 to decrease cardiac risk. Advised to aim for at least 150 minutes of exercise per week.       Return for 1 year physical. Patient was given opportunity to ask questions. Patient verbalized understanding of the plan and was able to repeat key elements of the plan. All questions were answered to their satisfaction.   I, Bruna Creighton, NP, have reviewed all documentation for this visit. The documentation on 07/04/2024 for the exam, diagnosis, procedures, and orders are all accurate and complete.

## 2024-07-04 NOTE — Assessment & Plan Note (Signed)
 Low fat diet advised.

## 2024-07-04 NOTE — Assessment & Plan Note (Signed)
 States Escitalopram  is working, continue current treatment.

## 2024-07-04 NOTE — Assessment & Plan Note (Signed)
 She is encouraged to strive for BMI less than 30 to decrease cardiac risk. Advised to aim for at least 150 minutes of exercise per week.

## 2024-07-05 LAB — HIV ANTIBODY (ROUTINE TESTING W REFLEX): HIV Screen 4th Generation wRfx: NONREACTIVE

## 2024-07-05 LAB — LIPID PANEL
Chol/HDL Ratio: 3.3 ratio (ref 0.0–4.4)
Cholesterol, Total: 223 mg/dL — ABNORMAL HIGH (ref 100–199)
HDL: 68 mg/dL (ref >39)
LDL Chol Calc (NIH): 141 mg/dL — ABNORMAL HIGH (ref 0–99)
Triglycerides: 81 mg/dL (ref 0–149)
VLDL Cholesterol Cal: 14 mg/dL (ref 5–40)

## 2024-07-05 LAB — CMP14+EGFR
ALT: 28 IU/L (ref 0–32)
AST: 22 IU/L (ref 0–40)
Albumin: 4.7 g/dL (ref 4.0–5.0)
Alkaline Phosphatase: 54 IU/L (ref 44–121)
BUN/Creatinine Ratio: 13 (ref 9–23)
BUN: 10 mg/dL (ref 6–20)
Bilirubin Total: 0.4 mg/dL (ref 0.0–1.2)
CO2: 19 mmol/L — ABNORMAL LOW (ref 20–29)
Calcium: 9.3 mg/dL (ref 8.7–10.2)
Chloride: 101 mmol/L (ref 96–106)
Creatinine, Ser: 0.75 mg/dL (ref 0.57–1.00)
Globulin, Total: 2.7 g/dL (ref 1.5–4.5)
Glucose: 70 mg/dL (ref 70–99)
Potassium: 4.6 mmol/L (ref 3.5–5.2)
Sodium: 140 mmol/L (ref 134–144)
Total Protein: 7.4 g/dL (ref 6.0–8.5)
eGFR: 115 mL/min/1.73 (ref >59)

## 2024-07-05 LAB — CBC
Hematocrit: 43.1 % (ref 34.0–46.6)
Hemoglobin: 13.2 g/dL (ref 11.1–15.9)
MCH: 28.5 pg (ref 26.6–33.0)
MCHC: 30.6 g/dL — ABNORMAL LOW (ref 31.5–35.7)
MCV: 93 fL (ref 79–97)
Platelets: 202 x10E3/uL (ref 150–450)
RBC: 4.63 x10E6/uL (ref 3.77–5.28)
RDW: 13.2 % (ref 11.7–15.4)
WBC: 3.8 x10E3/uL (ref 3.4–10.8)

## 2024-07-05 LAB — HEPATITIS C ANTIBODY: Hep C Virus Ab: NONREACTIVE

## 2024-07-05 LAB — RPR: RPR Ser Ql: NONREACTIVE

## 2024-07-06 LAB — NUSWAB VAGINITIS PLUS (VG+)
Atopobium vaginae: HIGH {score} — AB
Candida albicans, NAA: NEGATIVE
Candida glabrata, NAA: NEGATIVE
Chlamydia trachomatis, NAA: NEGATIVE
Megasphaera 1: HIGH {score} — AB
Neisseria gonorrhoeae, NAA: NEGATIVE
Trich vag by NAA: NEGATIVE

## 2024-07-16 ENCOUNTER — Encounter: Payer: Self-pay | Admitting: Family Medicine

## 2024-07-17 ENCOUNTER — Other Ambulatory Visit: Payer: Self-pay | Admitting: Family Medicine

## 2024-07-17 ENCOUNTER — Ambulatory Visit: Payer: Self-pay | Admitting: Family Medicine

## 2024-07-17 ENCOUNTER — Encounter: Payer: Self-pay | Admitting: Family Medicine

## 2024-07-17 NOTE — Progress Notes (Signed)
 Cholesterol levels are elevated, low fats diet advised with exercise.  Your blood count, electrolytes have improved tremendously continue to eat foods rich in iron.  Your STD panel came back negative for major STDs and BV was inconclusive do you have any symptoms ?  Let me know if you have any questions  Thank you

## 2024-07-25 ENCOUNTER — Encounter: Payer: Self-pay | Admitting: Family Medicine

## 2024-08-08 ENCOUNTER — Encounter: Payer: Self-pay | Admitting: Family Medicine

## 2024-08-08 ENCOUNTER — Ambulatory Visit: Payer: Self-pay | Admitting: Family Medicine

## 2024-08-08 VITALS — BP 110/80 | HR 89 | Temp 98.4°F | Ht 63.0 in | Wt 174.0 lb

## 2024-08-08 DIAGNOSIS — E66811 Obesity, class 1: Secondary | ICD-10-CM | POA: Diagnosis not present

## 2024-08-08 DIAGNOSIS — Z30011 Encounter for initial prescription of contraceptive pills: Secondary | ICD-10-CM

## 2024-08-08 DIAGNOSIS — N946 Dysmenorrhea, unspecified: Secondary | ICD-10-CM

## 2024-08-08 DIAGNOSIS — E6609 Other obesity due to excess calories: Secondary | ICD-10-CM

## 2024-08-08 DIAGNOSIS — Z6830 Body mass index (BMI) 30.0-30.9, adult: Secondary | ICD-10-CM | POA: Diagnosis not present

## 2024-08-08 LAB — POCT URINE PREGNANCY: Preg Test, Ur: NEGATIVE

## 2024-08-08 MED ORDER — NORETHIN ACE-ETH ESTRAD-FE 1-20 MG-MCG PO TABS
1.0000 | ORAL_TABLET | Freq: Every day | ORAL | 11 refills | Status: AC
Start: 2024-08-08 — End: ?

## 2024-08-08 NOTE — Progress Notes (Signed)
 I,Jameka J Llittleton, CMA,acting as a Neurosurgeon for [employer], NP.,have documented all relevant documentation on the behalf of Bruna Creighton, NP,as directed by  Bruna Creighton, NP while in the presence of Bruna Creighton, NP.  Subjective:  Patient ID: Joyce Hodge Resides , female    DOB: 2001-07-11 , 23 y.o.   MRN: [id number]  Chief Complaint  Patient presents with   Contraception    HPI Discussed the use of AI scribe software for clinical note transcription with the patient, who gave verbal consent to proceed.  History of Present Illness     Joyce Hodge is a 23 year old female who presents for consultation regarding birth control options.  She is interested in starting birth control pills and has previously used them for about two years after the birth of her child, who is now four years old. She stopped taking them due to moodiness. She does not recall the specific type of birth control pills she used previously, as they were changed twice.  She is currently on her menstrual period, which started yesterday, and typically lasts four to five days. No heavy bleeding is reported during her periods.  She has not experienced any adverse reactions to birth control pills in the past.      Past Medical History:  Diagnosis Date   Anxiety    UTI (urinary tract infection)      Family History  Problem Relation Age of Onset   Healthy Mother    Healthy Father      Current Outpatient Medications:    clobetasol  (TEMOVATE ) 0.05 % external solution, Apply 1 Application topically 2 (two) times daily. Apply to scalp daily as needed for itch, Disp: 50 mL, Rfl: 2   escitalopram  (LEXAPRO ) 10 MG tablet, Take 1 tablet (10 mg total) by mouth daily., Disp: 90 tablet, Rfl: 2   famotidine  (PEPCID ) 20 MG tablet, Take 20 mg by mouth 2 (two) times daily., Disp: , Rfl:    hydrOXYzine  (ATARAX ) 25 MG tablet, Take 1 tablet (25 mg total) by mouth 3 (three) times daily as needed., Disp: 30 tablet, Rfl: 0    norethindrone -ethinyl estradiol -FE (JUNEL FE 1/20) 1-20 MG-MCG tablet, Take 1 tablet by mouth daily., Disp: 28 tablet, Rfl: 11   spironolactone  (ALDACTONE ) 100 MG tablet, Take 1 tablet (100 mg total) by mouth daily., Disp: 30 tablet, Rfl: 3   tretinoin  (RETIN-A ) 0.1 % cream, Apply topically at bedtime., Disp: 45 g, Rfl: 1   Vitamin D , Ergocalciferol , (DRISDOL ) 1.25 MG (50000 UNIT) CAPS capsule, Take 1 capsule (50,000 Units total) by mouth every 7 (seven) days., Disp: 5 capsule, Rfl: 2   No Known Allergies   Review of Systems  Constitutional: Negative.   HENT: Negative.    Respiratory: Negative.    Cardiovascular: Negative.   Genitourinary:  Positive for menstrual problem.  Skin: Negative.      Today's Vitals   08/08/24 1554  BP: 110/80  Pulse: 89  Temp: 98.4 F (36.9 C)  TempSrc: Oral  Weight: 174 lb (78.9 kg)  Height: 5' 3 (1.6 m)  PainSc: 0-No pain   Body mass index is 30.82 kg/m.  Wt Readings from Last 3 Encounters:  08/08/24 174 lb (78.9 kg)  07/04/24 185 lb (83.9 kg)  12/29/23 178 lb 3.2 oz (80.8 kg)    The ASCVD Risk score (Arnett DK, et al., 2019) failed to calculate for the following reasons:   The 2019 ASCVD risk score is only valid for ages 68 to 53  Objective:  Physical Exam HENT:     Head: Normocephalic.  Cardiovascular:     Rate and Rhythm: Normal rate and regular rhythm.  Pulmonary:     Effort: Pulmonary effort is normal.     Breath sounds: Normal breath sounds.  Neurological:     General: No focal deficit present.     Mental Status: She is alert and oriented to person, place, and time.         Assessment And Plan:  Dysmenorrhea Assessment & Plan: - Advised daily intake at the same time and to take missed pill promptly. - Counseled on using backup contraception with antibiotics.  Orders: -     POCT urine pregnancy -     Norethin  Ace-Eth Estrad-FE; Take 1 tablet by mouth daily.  Dispense: 28 tablet; Refill: 11  Class 1 obesity due to excess  calories with body mass index (BMI) of 30.0 to 30.9 in adult, unspecified whether serious comorbidity present Assessment & Plan: She is encouraged to strive for BMI less than 30 to decrease cardiac risk. Advised to aim for at least 150 minutes of exercise per week.      Return if symptoms worsen or fail to improve, for keep previously scheduled appt.  Patient was given opportunity to ask questions. Patient verbalized understanding of the plan and was able to repeat key elements of the plan. All questions were answered to their satisfaction.    Assessment & Plan Contraceptive management with oral contraceptive pills She opted for oral contraceptive pills, previously used without adverse reactions except moodiness. Currently menstruating, confirming non-pregnancy. Informed about daily adherence and decreased efficacy with antibiotics. - Prescribed oral contraceptive pills with one-year refills. - Instructed to start immediately due to current menstruation.    I, Bruna Creighton, NP, have reviewed all documentation for this visit. The documentation on 08/31/2024 for the exam, diagnosis, procedures, and orders are all accurate and complete.   IF YOU HAVE BEEN REFERRED TO A SPECIALIST, IT MAY TAKE 1-2 WEEKS TO SCHEDULE/PROCESS THE REFERRAL. IF YOU HAVE NOT HEARD FROM US /SPECIALIST IN TWO WEEKS, PLEASE GIVE US  A CALL AT [phone].

## 2024-08-31 DIAGNOSIS — N946 Dysmenorrhea, unspecified: Secondary | ICD-10-CM | POA: Insufficient documentation

## 2024-08-31 NOTE — Assessment & Plan Note (Signed)
-   Advised daily intake at the same time and to take missed pill promptly. - Counseled on using backup contraception with antibiotics.

## 2024-08-31 NOTE — Assessment & Plan Note (Signed)
 She is encouraged to strive for BMI less than 30 to decrease cardiac risk. Advised to aim for at least 150 minutes of exercise per week.

## 2024-09-27 ENCOUNTER — Other Ambulatory Visit (HOSPITAL_COMMUNITY)
Admission: RE | Admit: 2024-09-27 | Discharge: 2024-09-27 | Disposition: A | Discharge status: Home / Self Care | Source: Ambulatory Visit | Attending: Obstetrics and Gynecology | Admitting: Obstetrics and Gynecology

## 2024-09-27 ENCOUNTER — Ambulatory Visit

## 2024-09-27 VITALS — BP 104/69 | HR 100 | Ht 62.0 in | Wt 158.0 lb

## 2024-09-27 DIAGNOSIS — N898 Other specified noninflammatory disorders of vagina: Secondary | ICD-10-CM | POA: Diagnosis not present

## 2024-09-27 NOTE — Progress Notes (Addendum)
 SUBJECTIVE:  23 y.o. female complains of creamy vaginal discharge for 2 week(s). Denies abnormal vaginal bleeding or significant pelvic pain or fever. No UTI symptoms. Denies history of known exposure to STD.  Patient's last menstrual period was 09/04/2024 (approximate).  OBJECTIVE:  She appears well, afebrile. Urine dipstick: not done.  ASSESSMENT:  Vaginal Discharge  Vaginal Odor   PLAN:  GC, chlamydia, trichomonas, BVAG, CVAG probe sent to lab. Treatment: To be determined once lab results are received ROV prn if symptoms persist or worsen.

## 2024-10-01 ENCOUNTER — Ambulatory Visit: Payer: Self-pay | Admitting: Obstetrics and Gynecology

## 2024-10-01 DIAGNOSIS — B9689 Other specified bacterial agents as the cause of diseases classified elsewhere: Secondary | ICD-10-CM

## 2024-10-01 LAB — CERVICOVAGINAL ANCILLARY ONLY
Bacterial Vaginitis (gardnerella): POSITIVE — AB
Candida Glabrata: NEGATIVE
Candida Vaginitis: NEGATIVE
Chlamydia: NEGATIVE
Comment: NEGATIVE
Comment: NEGATIVE
Comment: NEGATIVE
Comment: NEGATIVE
Comment: NEGATIVE
Comment: NORMAL
Neisseria Gonorrhea: NEGATIVE
Trichomonas: NEGATIVE

## 2024-10-01 MED ORDER — METRONIDAZOLE 0.75 % VA GEL
1.0000 | Freq: Every day | VAGINAL | 1 refills | Status: AC
Start: 2024-10-01 — End: ?

## 2024-12-12 ENCOUNTER — Ambulatory Visit: Admitting: Dermatology

## 2024-12-12 ENCOUNTER — Encounter: Payer: Self-pay | Admitting: Dermatology

## 2024-12-12 VITALS — BP 108/69

## 2024-12-12 DIAGNOSIS — L219 Seborrheic dermatitis, unspecified: Secondary | ICD-10-CM | POA: Diagnosis not present

## 2024-12-12 DIAGNOSIS — L700 Acne vulgaris: Secondary | ICD-10-CM | POA: Diagnosis not present

## 2024-12-12 DIAGNOSIS — L810 Postinflammatory hyperpigmentation: Secondary | ICD-10-CM

## 2024-12-12 DIAGNOSIS — L853 Xerosis cutis: Secondary | ICD-10-CM | POA: Diagnosis not present

## 2024-12-12 MED ORDER — ZORYVE 0.3 % EX FOAM: 1.0000 | Freq: Every day | CUTANEOUS | 11 refills | Status: AC

## 2024-12-12 MED ORDER — SPIRONOLACTONE 100 MG PO TABS: 100.0000 mg | ORAL_TABLET | Freq: Every day | ORAL | 11 refills | Status: AC

## 2024-12-12 NOTE — Patient Instructions (Signed)

## 2024-12-12 NOTE — Progress Notes (Signed)
° °  Follow-Up Visit   Subjective  Joyce Hodge is a 23 y.o. female established patient who presents for FOLLOW UP on the diagnoses listed below:  Patient (and/or pt guardian) consented to the use of AI-assisted tools for note generation.  Patient was last evaluated on 07/03/24.   Acne: Pt stated that she has decreased Tretinoin  0.1% use to 1-2 times a week now due to the weather change. She has continued applying hyaluronic acid and Avene cicalfate after cleansing.   SebDerm: Pt stated that she has noticed a decrease in itching after consistent use washing with DHS Zinc shampoo an using clobetasol  solution PRN. She stated her itch is now a 4/10.    Are you nursing, pregnant or trying to conceive? no   The following portions of the chart were reviewed this encounter and updated as appropriate: medications, allergies, medical history  Review of Systems:  No other skin or systemic complaints except as noted in HPI or Assessment and Plan.  Objective  Well appearing patient in no apparent distress; mood and affect are within normal limits.   A focused examination was performed of the following areas: scalp   Relevant exam findings are noted in the Assessment and Plan.          Assessment & Plan    Acne vulgaris Increased flares and hyperpigmentation due to reduced tretinoin  use. Previous doxycycline  trial unsuccessful due to GI side effects. Spironolactone  prescribed but not taken. Oral medication needed for deeper cystic acne.  - Start spironolactone  100 mg once daily with dinner. - Monitor for lightheadedness or dizziness; adjust dose if necessary. - Use Radiant Home serum by Usern for hyperpigmentation, morning and night. - Continue tretinoin  two nights a week; reduce to one night a week if excessive peeling occurs. - Use hyaluronic acid serum before tretinoin  application. - Switch to hydrating, creamy face wash for winter use.  Seborrheic dermatitis of the  scalp Flaking and itchiness, exacerbated by braids. Current treatment with DHS Zinc shampoo and clobetasol  drops. Need for alternative treatment due to insurance coverage issues.  - Use Zoryve  foam daily until scalp clears, then twice a week. - Apply scalp clarifying gel with salicylic acid and menthol  every 2-3 weeks. - Use warm water and washcloth to clean scalp every 2-3 weeks. - If Zoryve  is not covered by insurance, revert to clobetasol  drops.  Xerosis cutis Persistent dryness despite current moisturizing regimen. Current use of Avene moisturizer and hyaluronic acid serum.  - Continue using hyaluronic acid serum daily. - Switch to hydrating, creamy face wash for winter use. - Consider exfoliating with a washcloth to remove dead skin before applying moisturizer.     Return in about 10 months (around 10/12/2025) for ACNE, SEB DERM.   Documentation: I have reviewed the above documentation for accuracy and completeness, and I agree with the above.  I, Shirron Maranda, CMA II, am acting as scribe for:  Delon Lenis, DO

## 2025-07-10 ENCOUNTER — Encounter: Admitting: Family Medicine

## 2025-07-11 ENCOUNTER — Encounter: Payer: Self-pay | Admitting: Family Medicine
